# Patient Record
Sex: Male | Born: 1995 | Race: White | Hispanic: No | Marital: Single | Smoking: Former smoker
Health system: Southern US, Community
[De-identification: ages and names within clinical notes are randomized; demographics above are authoritative.]

## PROBLEM LIST (undated history)

## (undated) DIAGNOSIS — F909 Attention-deficit hyperactivity disorder, unspecified type: Secondary | ICD-10-CM

## (undated) DIAGNOSIS — E039 Hypothyroidism, unspecified: Secondary | ICD-10-CM

## (undated) DIAGNOSIS — R413 Other amnesia: Secondary | ICD-10-CM

## (undated) DIAGNOSIS — Z86711 Personal history of pulmonary embolism: Secondary | ICD-10-CM

## (undated) DIAGNOSIS — S60551A Superficial foreign body of right hand, initial encounter: Secondary | ICD-10-CM

## (undated) DIAGNOSIS — F952 Tourette's disorder: Secondary | ICD-10-CM

## (undated) DIAGNOSIS — F319 Bipolar disorder, unspecified: Secondary | ICD-10-CM

## (undated) HISTORY — PX: NO PAST SURGERIES: SHX2092

## (undated) HISTORY — DX: Personal history of pulmonary embolism: Z86.711

---
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
## 2014-08-11 DIAGNOSIS — J029 Acute pharyngitis, unspecified: Secondary | ICD-10-CM | POA: Diagnosis not present

## 2014-08-11 DIAGNOSIS — R0982 Postnasal drip: Secondary | ICD-10-CM | POA: Diagnosis not present

## 2014-08-11 DIAGNOSIS — H578 Other specified disorders of eye and adnexa: Secondary | ICD-10-CM | POA: Diagnosis not present

## 2014-08-11 DIAGNOSIS — R05 Cough: Secondary | ICD-10-CM | POA: Diagnosis not present

## 2014-08-11 DIAGNOSIS — J069 Acute upper respiratory infection, unspecified: Secondary | ICD-10-CM | POA: Diagnosis not present

## 2014-08-11 DIAGNOSIS — F17210 Nicotine dependence, cigarettes, uncomplicated: Secondary | ICD-10-CM | POA: Diagnosis not present

## 2014-08-11 DIAGNOSIS — R079 Chest pain, unspecified: Secondary | ICD-10-CM | POA: Diagnosis not present

## 2014-08-12 DIAGNOSIS — R079 Chest pain, unspecified: Secondary | ICD-10-CM | POA: Diagnosis not present

## 2015-06-02 DIAGNOSIS — F17200 Nicotine dependence, unspecified, uncomplicated: Secondary | ICD-10-CM | POA: Diagnosis not present

## 2015-06-02 DIAGNOSIS — E039 Hypothyroidism, unspecified: Secondary | ICD-10-CM | POA: Diagnosis not present

## 2015-06-02 DIAGNOSIS — S60551A Superficial foreign body of right hand, initial encounter: Secondary | ICD-10-CM | POA: Diagnosis not present

## 2015-06-14 ENCOUNTER — Encounter: Payer: Self-pay | Admitting: Family Medicine

## 2015-06-14 ENCOUNTER — Ambulatory Visit (INDEPENDENT_AMBULATORY_CARE_PROVIDER_SITE_OTHER): Payer: Medicaid Other | Admitting: Family Medicine

## 2015-06-14 VITALS — BP 119/77 | HR 86 | Ht 70.87 in | Wt 158.0 lb

## 2015-06-14 DIAGNOSIS — Z0000 Encounter for general adult medical examination without abnormal findings: Secondary | ICD-10-CM | POA: Diagnosis not present

## 2015-06-14 DIAGNOSIS — F909 Attention-deficit hyperactivity disorder, unspecified type: Secondary | ICD-10-CM | POA: Diagnosis not present

## 2015-06-14 DIAGNOSIS — F319 Bipolar disorder, unspecified: Secondary | ICD-10-CM | POA: Insufficient documentation

## 2015-06-14 DIAGNOSIS — F959 Tic disorder, unspecified: Secondary | ICD-10-CM | POA: Diagnosis not present

## 2015-06-14 DIAGNOSIS — F958 Other tic disorders: Secondary | ICD-10-CM | POA: Insufficient documentation

## 2015-06-14 LAB — COMPLETE METABOLIC PANEL WITH GFR
ALBUMIN: 4.2 g/dL (ref 3.6–5.1)
ALK PHOS: 83 U/L (ref 40–115)
ALT: 17 U/L (ref 9–46)
AST: 25 U/L (ref 10–40)
BILIRUBIN TOTAL: 0.9 mg/dL (ref 0.2–1.2)
BUN: 10 mg/dL (ref 7–25)
CALCIUM: 9.5 mg/dL (ref 8.6–10.3)
CHLORIDE: 104 mmol/L (ref 98–110)
CO2: 25 mmol/L (ref 20–31)
CREATININE: 0.68 mg/dL (ref 0.60–1.35)
GFR, Est Non African American: >89 mL/min (ref >=60)
Glucose, Bld: 95 mg/dL (ref 65–99)
Potassium: 4.3 mmol/L (ref 3.5–5.3)
Sodium: 139 mmol/L (ref 135–146)
TOTAL PROTEIN: 6.8 g/dL (ref 6.1–8.1)

## 2015-06-14 LAB — CBC
HEMATOCRIT: 40 % (ref 39.0–52.0)
Hemoglobin: 13.7 g/dL (ref 13.0–17.0)
MCH: 28.9 pg (ref 26.0–34.0)
MCHC: 34.3 g/dL (ref 30.0–36.0)
MCV: 84.4 fL (ref 78.0–100.0)
MPV: 10.4 fL (ref 8.6–12.4)
PLATELETS: 213 10*3/uL (ref 150–400)
RBC: 4.74 MIL/uL (ref 4.22–5.81)
RDW: 14 % (ref 11.5–15.5)
WBC: 4.3 10*3/uL (ref 4.0–10.5)

## 2015-06-14 LAB — LIPID PANEL
Cholesterol: 123 mg/dL — ABNORMAL LOW (ref 125–170)
HDL: 38 mg/dL — AB (ref >=40)
LDL CALC: 77 mg/dL (ref <110)
TRIGLYCERIDES: 39 mg/dL (ref <150)
Total CHOL/HDL Ratio: 3.2 Ratio (ref <=5.0)
VLDL: 8 mg/dL (ref <30)

## 2015-06-14 NOTE — Progress Notes (Signed)
CC: Vincent Shaw is a 19 y.o. male is here for Establish Care   Subjective: HPI:  Pleasant 19 year old here to establish care  Past medical history significant for bipolar disorder and Tourette syndrome. He has been off of all anti-psychotic medication and psychiatric medication for 11 months and tells me he feels more normal than he did when he was on medication.. He has no complaints today.  Review of Systems - General ROS: negative for - chills, fever, night sweats, weight gain or weight loss Ophthalmic ROS: negative for - decreased vision Psychological ROS: negative for - anxiety or depression ENT ROS: negative for - hearing change, nasal congestion, tinnitus or allergies Hematological and Lymphatic ROS: negative for - bleeding problems, bruising or swollen lymph nodes Breast ROS: negative Respiratory ROS: no cough, shortness of breath, or wheezing Cardiovascular ROS: no chest pain or dyspnea on exertion Gastrointestinal ROS: no abdominal pain, change in bowel habits, or black or bloody stools Genito-Urinary ROS: negative for - genital discharge, genital ulcers, incontinence or abnormal bleeding from genitals Musculoskeletal ROS: negative for - joint pain or muscle pain Neurological ROS: negative for - headaches or memory loss Dermatological ROS: negative for lumps, mole changes, rash and skin lesion changes  Past Medical History  Diagnosis Date  . 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 Bipolar 1 disorder (HCC)   . Tourette syndrome     History reviewed. No pertinent past surgical history. History reviewed. No pertinent family history.  Social History   Social History  . Marital Status: Single    Spouse Name: N/A  . Number of Children: N/A  . Years of Education: N/A   Occupational History  . Not on file.   Social History Main Topics  . Smoking status: Current Every Day Smoker -- 1.00 packs/day    Types: Cigarettes  . Smokeless tobacco: Not on  file  . Alcohol Use: No  . Drug Use: No  . Sexual Activity: Yes    Birth Control/ Protection: None   Other Topics Concern  . Not on file   Social History Narrative     Objective: BP 119/77 mmHg  Pulse 86  Ht 5' 10.87" (1.8 m)  Wt 158 lb (71.668 kg)  BMI 22.12 kg/m2  General: No Acute Distress HEENT: Atraumatic, normocephalic, conjunctivae normal without scleral icterus.  No nasal discharge, hearing grossly intact, TMs with good landmarks bilaterally with no middle ear abnormalities, posterior pharynx clear without oral lesions. Neck: Supple, trachea midline, no cervical nor supraclavicular adenopathy. Pulmonary: Clear to auscultation bilaterally without wheezing, rhonchi, nor rales. Cardiac: Regular rate and rhythm.  No murmurs, rubs, nor gallops. No peripheral edema.  2+ peripheral pulses bilaterally. Abdomen: Bowel sounds normal.  No masses.  Non-tender without rebound.  Negative Murphy's sign. MSK: Grossly intact, no signs of weakness.  Full strength throughout upper and lower extremities.  Full ROM in upper and lower extremities.  No midline spinal tenderness. Neuro: Gait unremarkable, CN II-XII grossly intact.  C5-C6 Reflex 2/4 Bilaterally, L4 Reflex 2/4 Bilaterally.  Cerebellar function intact. Skin: No rashes. Psych: Alert and oriented to person/place/time.  Thought process normal. No anxiety/depression. Assessment & Plan: Vincent Shaw was seen today for establish care.  Diagnoses and all orders for this visit:  Attention deficit hyperactivity disorder (ADHD), unspecified ADHD type  Bipolar I disorder (HCC)  Annual physical exam -     COMPLETE METABOLIC PANEL WITH GFR -     CBC -     Lipid panel  Motor tic disorder   Healthy lifestyle interventions including but not limited to regular exercise, a healthy low fat diet, moderation of salt intake, the dangers of tobacco/alcohol/recreational drug use, nutrition supplementation, and accident avoidance were discussed with  the patient and a handout was provided for future reference. Declines flu shot  No Follow-up on file.

## 2015-06-18 DIAGNOSIS — M795 Residual foreign body in soft tissue: Secondary | ICD-10-CM | POA: Diagnosis not present

## 2015-07-18 ENCOUNTER — Encounter: Payer: Self-pay | Admitting: Family Medicine

## 2015-07-18 DIAGNOSIS — D6851 Activated protein C resistance: Secondary | ICD-10-CM | POA: Insufficient documentation

## 2016-01-21 ENCOUNTER — Telehealth: Payer: Self-pay | Admitting: Family Medicine

## 2016-01-21 DIAGNOSIS — F319 Bipolar disorder, unspecified: Secondary | ICD-10-CM

## 2016-01-21 NOTE — Telephone Encounter (Signed)
Mr. Lucilla LameWhite's grandfather Domenic SchwabDonovan came in today and stated SS is requiring Ollis to have a psych evaluation and he would like for you to place a referral - [facility]

## 2016-01-21 NOTE — Telephone Encounter (Signed)
Grandfather notified

## 2016-01-21 NOTE — Telephone Encounter (Signed)
Referral has been placed, can you give them  [facility]

## 2016-01-21 NOTE — Telephone Encounter (Signed)
Awaiting call back.

## 2016-02-07 ENCOUNTER — Ambulatory Visit (INDEPENDENT_AMBULATORY_CARE_PROVIDER_SITE_OTHER): Payer: Medicaid Other | Admitting: Medical

## 2016-02-07 ENCOUNTER — Encounter (HOSPITAL_COMMUNITY): Payer: Self-pay | Admitting: Medical

## 2016-02-07 VITALS — BP 116/68 | HR 65 | Ht 70.0 in | Wt 156.0 lb

## 2016-02-07 DIAGNOSIS — F952 Tourette's disorder: Secondary | ICD-10-CM

## 2016-02-07 DIAGNOSIS — F39 Unspecified mood [affective] disorder: Secondary | ICD-10-CM

## 2016-02-07 DIAGNOSIS — D6851 Activated protein C resistance: Secondary | ICD-10-CM

## 2016-02-07 DIAGNOSIS — E032 Hypothyroidism due to medicaments and other exogenous substances: Secondary | ICD-10-CM | POA: Diagnosis not present

## 2016-02-07 DIAGNOSIS — F3481 Disruptive mood dysregulation disorder: Secondary | ICD-10-CM

## 2016-02-07 DIAGNOSIS — Z62819 Personal history of unspecified abuse in childhood: Secondary | ICD-10-CM

## 2016-02-07 DIAGNOSIS — F1220 Cannabis dependence, uncomplicated: Secondary | ICD-10-CM

## 2016-02-07 DIAGNOSIS — Z9151 Personal history of suicidal behavior: Secondary | ICD-10-CM

## 2016-02-07 DIAGNOSIS — Z9189 Other specified personal risk factors, not elsewhere classified: Secondary | ICD-10-CM

## 2016-02-07 DIAGNOSIS — Z915 Personal history of self-harm: Secondary | ICD-10-CM

## 2016-02-07 MED ORDER — LAMOTRIGINE 25 MG PO TABS: ORAL_TABLET | ORAL | Status: DC

## 2016-02-07 MED ORDER — CLONIDINE HCL 0.2 MG PO TABS: 0.2000 mg | ORAL_TABLET | Freq: Every day | ORAL | Status: DC

## 2016-02-07 MED ORDER — RISPERIDONE 2 MG PO TABS: 2.0000 mg | ORAL_TABLET | Freq: Two times a day (BID) | ORAL | Status: DC

## 2016-02-07 MED ORDER — TETRABENAZINE 12.5 MG PO TABS: ORAL_TABLET | ORAL | Status: DC
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
## 2016-02-10 DIAGNOSIS — F1221 Cannabis dependence, in remission: Secondary | ICD-10-CM | POA: Insufficient documentation

## 2016-02-10 DIAGNOSIS — Z62819 Personal history of unspecified abuse in childhood: Secondary | ICD-10-CM | POA: Insufficient documentation

## 2016-02-10 DIAGNOSIS — F952 Tourette's disorder: Secondary | ICD-10-CM | POA: Insufficient documentation

## 2016-02-10 DIAGNOSIS — Z915 Personal history of self-harm: Secondary | ICD-10-CM | POA: Insufficient documentation

## 2016-02-10 DIAGNOSIS — Z9151 Personal history of suicidal behavior: Secondary | ICD-10-CM | POA: Insufficient documentation

## 2016-02-12 DIAGNOSIS — E032 Hypothyroidism due to medicaments and other exogenous substances: Secondary | ICD-10-CM | POA: Diagnosis not present

## 2016-02-13 LAB — THYROID PANEL WITH TSH
Free Thyroxine Index: 2.3 (ref 1.4–3.8)
T3 Uptake: 35 % (ref 22–35)
T4 TOTAL: 6.5 ug/dL (ref 4.5–12.0)
TSH: 8.69 mIU/L — ABNORMAL HIGH (ref 0.40–4.50)

## 2016-02-19 ENCOUNTER — Telehealth (HOSPITAL_COMMUNITY): Payer: Self-pay | Admitting: *Deleted

## 2016-02-19 NOTE — Telephone Encounter (Signed)
Prior authorization received. Submitted online with cover my meds. Awaiting response.

## 2016-02-20 ENCOUNTER — Telehealth (HOSPITAL_COMMUNITY): Payer: Self-pay | Admitting: *Deleted

## 2016-02-20 NOTE — Telephone Encounter (Signed)
Response received from [employer] for prior authorization submitted online. Pharmacy required clinical support for prior authorization request of Tetrabenazine 12.5mg. Forms completed and faxed to [phone]. Awaiting response.  

## 2016-02-20 NOTE — Telephone Encounter (Signed)
Response received from [employer] for prior authorization submitted online. Pharmacy required clinical support for prior authorization request of Tetrabenazine 12.5mg . Forms completed and faxed to [phone]. Awaiting response.

## 2016-02-25 ENCOUNTER — Telehealth (HOSPITAL_COMMUNITY): Payer: Self-pay | Admitting: Medical

## 2016-02-25 NOTE — Telephone Encounter (Signed)
Received fax from [employer] for prior authorization #[id number]. Prior authorization was denied for Tetrabenazine. Per [facility], pt will need to try and fail Haldol before insurance will cover medication. Provider notified.

## 2016-02-25 NOTE — Telephone Encounter (Signed)
Grandfather called and stated that his 3rd med was not being covered by insurance and will cost him over $4000. needs something else called in. he could not tell me which med it was he was talking about. he got the Lamictal filled and the clonidine fill.  Prior authorization was submitted online for Tetrabenazine on 02/20/16. Received response on 02/25/16. Medication coverage was denied by insurance. Pt will need to try and fail Haldol. Please advise.

## 2016-02-27 NOTE — Telephone Encounter (Signed)
PT UNABLE TO TOLERATE/FAILED RISPERDAL-EPS UNWILLING TO EXPOSE HIM TO HALDOL RISK-PLEASE ADVISE INSURER-APPEAL DECISION THANKS

## 2016-02-29 ENCOUNTER — Telehealth (HOSPITAL_COMMUNITY): Payer: Self-pay | Admitting: *Deleted

## 2016-02-29 NOTE — Telephone Encounter (Signed)
Prior authorization for tetrabenazine received. Submitted online with cover my meds. Awaiting response to be faxed.

## 2016-03-05 NOTE — Telephone Encounter (Signed)
Thanks

## 2016-03-06 ENCOUNTER — Encounter (HOSPITAL_COMMUNITY): Payer: Self-pay | Admitting: Medical

## 2016-03-06 ENCOUNTER — Ambulatory Visit (INDEPENDENT_AMBULATORY_CARE_PROVIDER_SITE_OTHER): Payer: Medicaid Other | Admitting: Medical

## 2016-03-06 VITALS — BP 112/62 | HR 66 | Ht 70.0 in | Wt 156.4 lb

## 2016-03-06 DIAGNOSIS — Z9151 Personal history of suicidal behavior: Secondary | ICD-10-CM

## 2016-03-06 DIAGNOSIS — Z62819 Personal history of unspecified abuse in childhood: Secondary | ICD-10-CM | POA: Diagnosis not present

## 2016-03-06 DIAGNOSIS — F952 Tourette's disorder: Secondary | ICD-10-CM | POA: Diagnosis not present

## 2016-03-06 DIAGNOSIS — F3481 Disruptive mood dysregulation disorder: Secondary | ICD-10-CM

## 2016-03-06 DIAGNOSIS — Z915 Personal history of self-harm: Secondary | ICD-10-CM

## 2016-03-06 DIAGNOSIS — F39 Unspecified mood [affective] disorder: Secondary | ICD-10-CM | POA: Diagnosis not present

## 2016-03-06 DIAGNOSIS — D6851 Activated protein C resistance: Secondary | ICD-10-CM

## 2016-03-06 DIAGNOSIS — F1220 Cannabis dependence, uncomplicated: Secondary | ICD-10-CM

## 2016-03-06 DIAGNOSIS — Z9189 Other specified personal risk factors, not elsewhere classified: Secondary | ICD-10-CM

## 2016-03-06 NOTE — Progress Notes (Signed)
BH MD/PA/NP OP Progress Note  03/06/2016 3:15 PM Vincent Shaw  MRN:  [id number]  Chief Complaint:  Chief Complaint    Follow-up     Subjective:   HPI:  Visit Diagnosis: No diagnosis found.  Past Psychiatric History:   Past Medical History:  Past Medical History:  Diagnosis Date  . 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 Bipolar 1 disorder (HCC)   . Tourette syndrome    No past surgical history on file.  Family Psychiatric History:   Family History: No family history on file.  Social History:  Social History   Social History  . Marital status: Single    Spouse name: N/A  . Number of children: N/A  . Years of education: N/A   Social History Main Topics  . Smoking status: Light Tobacco Smoker    Packs/day: 0.25    Types: Cigarettes  . Smokeless tobacco: Not on file  . Alcohol use 1.2 oz/week    2 Shots of liquor per week  . Drug use: No  . Sexual activity: Yes    Birth control/ protection: None   Other Topics Concern  . Not on file   Social History Narrative  . No narrative on file    Allergies:  Allergies  Allergen Reactions  . Risperdal [Risperidone] Other (See Comments)    Dyskinesia    Metabolic Disorder Labs: Lab Results  Component Value Date   HGBA1C  07/31/2009    5.9 (NOTE) The ADA recommends the following therapeutic goal for glycemic control related to Hgb A1c measurement: Goal of therapy: <6.5 Hgb A1c  Reference: American Diabetes Association: Clinical Practice Recommendations 2010, Diabetes Care, 2010, 33: (Suppl  1).   MPG 123 07/31/2009   Lab Results  Component Value Date   PROLACTIN  07/31/2009    13.9 (NOTE)     Reference Ranges:                 Male:                       2.1 -  17.1 ng/ml                 Male:   Pregnant          9.7 - 208.5 ng/mL                           Non Pregnant      2.8 -  29.2 ng/mL                           Post  Menopausal   1.8 -  20.3 ng/mL                     Lab  Results  Component Value Date   CHOL 123 (L) 06/14/2015   TRIG 39 06/14/2015   HDL 38 (L) 06/14/2015   CHOLHDL 3.2 06/14/2015   VLDL 8 06/14/2015   LDLCALC 77 06/14/2015   LDLCALC  07/31/2009    100        Total Cholesterol/HDL:CHD Risk Coronary Heart Disease Risk Table                     Men   Women  1/2 Average Risk   3.4   3.3  Average Risk       5.0  4.4  2 X Average Risk   9.6   7.1  3 X Average Risk  23.4   11.0        Use the calculated Patient Ratio above and the CHD Risk Table to determine the patient's CHD Risk.        ATP III CLASSIFICATION (LDL):  <100     mg/dL   Optimal  161-096  mg/dL   Near or Above                    Optimal  130-159  mg/dL   Borderline  045-409  mg/dL   High  >811     mg/dL   Very High     Current Medications: Current Outpatient Prescriptions  Medication Sig Dispense Refill  . cloNIDine (CATAPRES) 0.2 MG tablet Take 1 tablet (0.2 mg total) by mouth at bedtime. (Patient not taking: Reported on 03/06/2016) 30 tablet 2  . guanFACINE (INTUNIV) 4 MG TB24 SR tablet Take by mouth.    . hydrOXYzine (ATARAX/VISTARIL) 50 MG tablet Take by mouth.    . lamoTRIgine (LAMICTAL) 25 MG tablet Take 1 tab QD x 5 days then 2 tabs x 5 days then 3 tabs x 5days then 4 tabs daily (Patient not taking: Reported on 03/06/2016) 90 tablet 0  . levothyroxine (SYNTHROID, LEVOTHROID) 75 MCG tablet Take by mouth.    . tetrabenazine (XENAZINE) 12.5 MG tablet Take 1 tablet daily for 1 week then 2 tablets daily forTICs (Patient not taking: Reported on 03/06/2016) 60 tablet 0  . traZODone (DESYREL) 50 MG tablet Take 50 mg by mouth.     No current facility-administered medications for this visit.     Neurologic: Headache: Negative Seizure: Negative Paresthesias: Negative  Musculoskeletal: Strength & Muscle Tone: within normal limits and Tic of face/head and neck Gait & Station: normal Patient leans: Front and N/A  Psychiatric Specialty Exam: ROS NO  change Psychiatric/Behavioral: Positive for substance abuse. Negative for depression, suicidal ideas, hallucinations and memory loss. The patient has insomnia. The patient is not nervous/anxious.     General ROS: negative for - chills, fever, night sweats, weight gain or weight loss Ophthalmic ROS: negative for - decreased vision Psychological ROS: negative for - anxiety or depression ENT ROS: negative for - hearing change, nasal congestion, tinnitus or allergies Hematological and Lymphatic ROS: negative for - bleeding problems, bruising or swollen lymph nodes Breast ROS: negative Respiratory ROS: no cough, shortness of breath, or wheezing Cardiovascular ROS: no chest pain or dyspnea on exertion Gastrointestinal ROS: no abdominal pain, change in bowel habits, or black or bloody stools Genito-Urinary ROS: negative for - genital discharge, genital ulcers, incontinence or abnormal bleeding from genitals Musculoskeletal ROS: negative for - joint pain or muscle pain Neurological ROS: negative for - headaches or memory loss + Tics Dermatological ROS: negative for lumps, mole changes, rash and skin lesion changes   Blood pressure 112/62, pulse 66, height  (1.778 m), weight 156 lb 6.4 oz (70.9 kg), SpO2 98 %.Body mass index is 22.44 kg/m.  General Appearance: Fairly Groomed  Eye Contact:  Good  Speech:  Clear and Coherent  Volume:  Normal  Mood:  Dysphoric  Affect:  Congruent  Thought Process:  Coherent and Goal Directed  Orientation:  Full (Time, Place, and Person)  Thought Content: WDL, Rumination and Concerned an bout past experieences with medications   Suicidal Thoughts:  No  Homicidal Thoughts:  No  Memory:  Negative  Judgement:  Fair  Insight:  Lacking  Psychomotor Activity:  Normal  Concentration:  Concentration: Good and Attention Span: Good  Recall:  Good  Fund of Knowledge: Limited  Language: Fair  Akathisia:  Negative  Handed:  Right  AIMS (if indicated):  NA   Assets:  Desire for Improvement Financial Resources/Insurance Housing Resilience Social Support Transportation  ADL's:  Intact  Cognition: Impaired,  Mild  Sleep:  Better     Treatment Plan Summary:Start medications.FU 1 month.Begin counseling.Will discuss Cannabis use after rapport firmly established Pt informed of appeal for Toutrette's medication Tetrabenezine   Vincent MornCharles Britteny Fiebelkorn, PA-C 03/06/2016, 3:15 PM

## 2016-04-01 ENCOUNTER — Telehealth (HOSPITAL_COMMUNITY): Payer: Self-pay | Admitting: Medical

## 2016-04-01 ENCOUNTER — Ambulatory Visit (INDEPENDENT_AMBULATORY_CARE_PROVIDER_SITE_OTHER): Payer: Medicare Other | Admitting: Licensed Clinical Social Worker

## 2016-04-01 DIAGNOSIS — Z8659 Personal history of other mental and behavioral disorders: Secondary | ICD-10-CM

## 2016-04-01 DIAGNOSIS — Z62819 Personal history of unspecified abuse in childhood: Secondary | ICD-10-CM

## 2016-04-01 DIAGNOSIS — F3481 Disruptive mood dysregulation disorder: Secondary | ICD-10-CM

## 2016-04-01 DIAGNOSIS — F952 Tourette's disorder: Secondary | ICD-10-CM

## 2016-04-02 NOTE — Telephone Encounter (Signed)
Pt's father contact office to check the status of PA for Tetrabenazin submitted on 02/29/16. Contact [employer] at [phone]. Appeal was submitted for denial of Tetrabenzin. Ref #[id number], awaiting decision within next 72 hours.   Called and informed pt of PA status. Pt f/u appt is schedule for 04/03/16. Pt verbalizes understanding.

## 2016-04-02 NOTE — Telephone Encounter (Signed)
Thanks

## 2016-04-03 ENCOUNTER — Encounter (HOSPITAL_COMMUNITY): Payer: Self-pay | Admitting: Medical

## 2016-04-03 ENCOUNTER — Ambulatory Visit (INDEPENDENT_AMBULATORY_CARE_PROVIDER_SITE_OTHER): Payer: Medicaid Other | Admitting: Medical

## 2016-04-03 VITALS — BP 116/72 | HR 76 | Ht 70.0 in | Wt 156.0 lb

## 2016-04-03 DIAGNOSIS — D6851 Activated protein C resistance: Secondary | ICD-10-CM | POA: Diagnosis not present

## 2016-04-03 DIAGNOSIS — F3481 Disruptive mood dysregulation disorder: Secondary | ICD-10-CM | POA: Diagnosis not present

## 2016-04-03 DIAGNOSIS — F39 Unspecified mood [affective] disorder: Secondary | ICD-10-CM

## 2016-04-03 DIAGNOSIS — F959 Tic disorder, unspecified: Secondary | ICD-10-CM

## 2016-04-03 DIAGNOSIS — F1211 Cannabis abuse, in remission: Secondary | ICD-10-CM

## 2016-04-03 DIAGNOSIS — F319 Bipolar disorder, unspecified: Secondary | ICD-10-CM

## 2016-04-03 DIAGNOSIS — F1210 Cannabis abuse, uncomplicated: Secondary | ICD-10-CM | POA: Diagnosis not present

## 2016-04-03 DIAGNOSIS — F958 Other tic disorders: Secondary | ICD-10-CM

## 2016-04-03 DIAGNOSIS — Z62819 Personal history of unspecified abuse in childhood: Secondary | ICD-10-CM

## 2016-04-03 MED ORDER — LAMOTRIGINE 100 MG PO TABS: 100.0000 mg | ORAL_TABLET | Freq: Every day | ORAL | 2 refills | Status: DC

## 2016-04-03 MED ORDER — LEVOTHYROXINE SODIUM 75 MCG PO TABS: 75.0000 ug | ORAL_TABLET | Freq: Every day | ORAL | 1 refills | Status: DC

## 2016-04-03 MED ORDER — QUETIAPINE FUMARATE 25 MG PO TABS: 25.0000 mg | ORAL_TABLET | Freq: Every evening | ORAL | 2 refills | Status: DC | PRN
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
## 2016-04-04 NOTE — Telephone Encounter (Signed)
Received telephone from [facility]. Approval was given for Tetrabenazine from 04/04/16-07/04/16. Spoke w/ Corrie DandyMary at [facility], approval given for medication, pharmacy will contact pt once rx is ready for pickup.

## 2016-04-04 NOTE — Telephone Encounter (Signed)
Received [facility] dept for ref #[id number] for Tetrabenazine. Provider complete form for expedited appeal. Form was faxed to [phone]. Awaiting response.

## 2016-04-05 NOTE — Telephone Encounter (Signed)
thanks

## 2016-04-07 ENCOUNTER — Encounter (HOSPITAL_COMMUNITY): Payer: Self-pay | Admitting: Licensed Clinical Social Worker

## 2016-04-07 DIAGNOSIS — Z8659 Personal history of other mental and behavioral disorders: Secondary | ICD-10-CM | POA: Insufficient documentation

## 2016-04-07 DIAGNOSIS — F3481 Disruptive mood dysregulation disorder: Secondary | ICD-10-CM | POA: Insufficient documentation
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
## 2016-04-08 ENCOUNTER — Encounter: Payer: Self-pay | Admitting: Family Medicine

## 2016-04-08 ENCOUNTER — Ambulatory Visit (INDEPENDENT_AMBULATORY_CARE_PROVIDER_SITE_OTHER): Payer: Medicare Other | Admitting: Family Medicine

## 2016-04-08 VITALS — BP 127/68 | HR 56 | Wt 157.0 lb

## 2016-04-08 DIAGNOSIS — Z114 Encounter for screening for human immunodeficiency virus [HIV]: Secondary | ICD-10-CM | POA: Diagnosis not present

## 2016-04-08 DIAGNOSIS — Z23 Encounter for immunization: Secondary | ICD-10-CM

## 2016-04-08 DIAGNOSIS — S60551A Superficial foreign body of right hand, initial encounter: Secondary | ICD-10-CM

## 2016-04-08 DIAGNOSIS — E079 Disorder of thyroid, unspecified: Secondary | ICD-10-CM | POA: Diagnosis not present

## 2016-04-08 NOTE — Patient Instructions (Signed)
Thank you for coming in today. Get labs today.  You should hear from Dr Carlos LeveringGramig's office soon.  Return in 3 months for recheck.  We will adjust thyroid medicines based on lab results.

## 2016-04-09 LAB — TSH: TSH: 4.2 m[IU]/L (ref 0.40–4.50)

## 2016-04-09 LAB — CBC
HEMATOCRIT: 42.9 % (ref 38.5–50.0)
HEMOGLOBIN: 14.4 g/dL (ref 13.2–17.1)
MCH: 29.3 pg (ref 27.0–33.0)
MCHC: 33.6 g/dL (ref 32.0–36.0)
MCV: 87.4 fL (ref 80.0–100.0)
MPV: 10.7 fL (ref 7.5–12.5)
PLATELETS: 229 10*3/uL (ref 140–400)
RBC: 4.91 MIL/uL (ref 4.20–5.80)
RDW: 14 % (ref 11.0–15.0)
WBC: 6.2 10*3/uL (ref 3.8–10.8)

## 2016-04-09 LAB — COMPREHENSIVE METABOLIC PANEL
ALBUMIN: 4.4 g/dL (ref 3.6–5.1)
ALK PHOS: 64 U/L (ref 40–115)
ALT: 16 U/L (ref 9–46)
AST: 16 U/L (ref 10–40)
BUN: 15 mg/dL (ref 7–25)
CHLORIDE: 104 mmol/L (ref 98–110)
CO2: 28 mmol/L (ref 20–31)
Calcium: 9.6 mg/dL (ref 8.6–10.3)
Creat: 0.84 mg/dL (ref 0.60–1.35)
Glucose, Bld: 84 mg/dL (ref 65–99)
POTASSIUM: 4 mmol/L (ref 3.5–5.3)
Sodium: 140 mmol/L (ref 135–146)
TOTAL PROTEIN: 7.1 g/dL (ref 6.1–8.1)
Total Bilirubin: 0.5 mg/dL (ref 0.2–1.2)

## 2016-04-09 LAB — T4, FREE: Free T4: 1.2 ng/dL (ref 0.8–1.8)

## 2016-04-09 LAB — T3, FREE: T3 FREE: 3.7 pg/mL (ref 2.3–4.2)

## 2016-04-09 LAB — HIV ANTIBODY (ROUTINE TESTING W REFLEX): HIV: NONREACTIVE

## 2016-04-09 MED ORDER — LEVOTHYROXINE SODIUM 75 MCG PO TABS: 75.0000 ug | ORAL_TABLET | Freq: Every day | ORAL | 1 refills | Status: DC
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
## 2016-04-16 ENCOUNTER — Ambulatory Visit (INDEPENDENT_AMBULATORY_CARE_PROVIDER_SITE_OTHER): Payer: Medicare Other | Admitting: Licensed Clinical Social Worker

## 2016-04-16 DIAGNOSIS — F4310 Post-traumatic stress disorder, unspecified: Secondary | ICD-10-CM | POA: Diagnosis not present

## 2016-04-16 DIAGNOSIS — Z8659 Personal history of other mental and behavioral disorders: Secondary | ICD-10-CM

## 2016-04-16 DIAGNOSIS — F3481 Disruptive mood dysregulation disorder: Secondary | ICD-10-CM | POA: Diagnosis not present

## 2016-04-16 DIAGNOSIS — F952 Tourette's disorder: Secondary | ICD-10-CM | POA: Diagnosis not present

## 2016-04-17 ENCOUNTER — Ambulatory Visit (INDEPENDENT_AMBULATORY_CARE_PROVIDER_SITE_OTHER): Payer: Medicaid Other | Admitting: Medical

## 2016-04-17 ENCOUNTER — Ambulatory Visit (HOSPITAL_COMMUNITY): Payer: Self-pay | Admitting: Licensed Clinical Social Worker

## 2016-04-17 ENCOUNTER — Encounter (HOSPITAL_COMMUNITY): Payer: Self-pay | Admitting: Medical

## 2016-04-17 VITALS — BP 116/70 | HR 60 | Ht 70.0 in | Wt 158.0 lb

## 2016-04-17 DIAGNOSIS — F319 Bipolar disorder, unspecified: Secondary | ICD-10-CM

## 2016-04-17 DIAGNOSIS — Z62819 Personal history of unspecified abuse in childhood: Secondary | ICD-10-CM | POA: Diagnosis not present

## 2016-04-17 DIAGNOSIS — F3481 Disruptive mood dysregulation disorder: Secondary | ICD-10-CM | POA: Diagnosis not present

## 2016-04-17 DIAGNOSIS — F959 Tic disorder, unspecified: Secondary | ICD-10-CM

## 2016-04-17 DIAGNOSIS — D6851 Activated protein C resistance: Secondary | ICD-10-CM

## 2016-04-17 DIAGNOSIS — F1211 Cannabis abuse, in remission: Secondary | ICD-10-CM

## 2016-04-17 DIAGNOSIS — Z8659 Personal history of other mental and behavioral disorders: Secondary | ICD-10-CM

## 2016-04-17 DIAGNOSIS — F1210 Cannabis abuse, uncomplicated: Secondary | ICD-10-CM

## 2016-04-17 DIAGNOSIS — Z915 Personal history of self-harm: Secondary | ICD-10-CM

## 2016-04-17 DIAGNOSIS — F958 Other tic disorders: Secondary | ICD-10-CM

## 2016-04-17 DIAGNOSIS — F4310 Post-traumatic stress disorder, unspecified: Secondary | ICD-10-CM | POA: Insufficient documentation

## 2016-04-17 DIAGNOSIS — Z9151 Personal history of suicidal behavior: Secondary | ICD-10-CM

## 2016-04-17 DIAGNOSIS — Z9189 Other specified personal risk factors, not elsewhere classified: Secondary | ICD-10-CM

## 2016-04-17 MED ORDER — CLONIDINE HCL 0.2 MG PO TABS: 0.2000 mg | ORAL_TABLET | Freq: Every day | ORAL | 2 refills | Status: DC
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
## 2016-04-24 ENCOUNTER — Ambulatory Visit (HOSPITAL_COMMUNITY): Payer: Self-pay | Admitting: Medical

## 2016-04-27 DIAGNOSIS — S60551A Superficial foreign body of right hand, initial encounter: Secondary | ICD-10-CM

## 2016-04-27 HISTORY — DX: Superficial foreign body of right hand, initial encounter: S60.551A

## 2016-05-01 ENCOUNTER — Ambulatory Visit (INDEPENDENT_AMBULATORY_CARE_PROVIDER_SITE_OTHER): Payer: Medicare Other | Admitting: Licensed Clinical Social Worker

## 2016-05-01 DIAGNOSIS — F4310 Post-traumatic stress disorder, unspecified: Secondary | ICD-10-CM | POA: Diagnosis not present

## 2016-05-01 DIAGNOSIS — F3481 Disruptive mood dysregulation disorder: Secondary | ICD-10-CM | POA: Diagnosis not present

## 2016-05-01 DIAGNOSIS — F958 Other tic disorders: Secondary | ICD-10-CM | POA: Diagnosis not present
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
## 2016-05-06 ENCOUNTER — Other Ambulatory Visit: Payer: Self-pay | Admitting: Orthopedic Surgery

## 2016-05-06 DIAGNOSIS — S60551A Superficial foreign body of right hand, initial encounter: Secondary | ICD-10-CM | POA: Diagnosis not present

## 2016-05-08 ENCOUNTER — Encounter (HOSPITAL_BASED_OUTPATIENT_CLINIC_OR_DEPARTMENT_OTHER): Payer: Self-pay | Admitting: *Deleted

## 2016-05-12 ENCOUNTER — Ambulatory Visit (INDEPENDENT_AMBULATORY_CARE_PROVIDER_SITE_OTHER): Payer: Medicaid Other | Admitting: Licensed Clinical Social Worker

## 2016-05-12 DIAGNOSIS — F3481 Disruptive mood dysregulation disorder: Secondary | ICD-10-CM

## 2016-05-12 DIAGNOSIS — F958 Other tic disorders: Secondary | ICD-10-CM | POA: Diagnosis not present

## 2016-05-12 DIAGNOSIS — F4310 Post-traumatic stress disorder, unspecified: Secondary | ICD-10-CM

## 2016-05-12 NOTE — Progress Notes (Signed)
   THERAPIST PROGRESS NOTE  Session Time: 2:00pm-3:05pm  Participation Level: Active  Behavioral Response: Casual A bit drowsy (reported he has not been getting much sleep)  Somewhat anxious  Type of Therapy: Individual therapy  Treatment Goals addressed: Coping effectively with trauma triggers  Interventions: Assessment  Suicidal/Homicidal: Denied both  Therapist Interventions: Continued to learn details about patient's experiences, especially in the last two years.  Explored how his experiences have motivated him to change his life for the better.     Summary:  Opened up about overdosing on his medications in a suicide attempt approximately 2 years ago.  Described how he had gotten to that point of desperation and how his perspective about what is valuable in life changed afterwards.   Noted that he had been involved in a serious relationship that didn't work out.  They had been engaged.  York SpanielSaid that he is not focused on dating at this time.  He wants to get a handle on his emotions so that he is able to focus on going back to school. Reported that he is satisfied with his current living environment.                           Plan: Will explore his current sources of support at next session.        Diagnosis:  Disruptive Mood Dysregulation Disorder                      PTSD                      History of conduct disorder     Darrin LuisSolomon, Sarah A, LCSW 05/12/2016

## 2016-05-15 ENCOUNTER — Ambulatory Visit (HOSPITAL_BASED_OUTPATIENT_CLINIC_OR_DEPARTMENT_OTHER): Payer: Medicare Other | Admitting: Anesthesiology

## 2016-05-15 ENCOUNTER — Ambulatory Visit (HOSPITAL_BASED_OUTPATIENT_CLINIC_OR_DEPARTMENT_OTHER)
Admission: RE | Admit: 2016-05-15 | Discharge: 2016-05-15 | Disposition: A | Discharge status: Home / Self Care | Payer: Medicare Other | Source: Ambulatory Visit | Attending: Orthopedic Surgery | Admitting: Orthopedic Surgery

## 2016-05-15 ENCOUNTER — Encounter (HOSPITAL_BASED_OUTPATIENT_CLINIC_OR_DEPARTMENT_OTHER)
Admission: RE | Disposition: A | Discharge status: Home / Self Care | Payer: Self-pay | Source: Ambulatory Visit | Attending: Orthopedic Surgery

## 2016-05-15 ENCOUNTER — Encounter (HOSPITAL_BASED_OUTPATIENT_CLINIC_OR_DEPARTMENT_OTHER): Payer: Self-pay | Admitting: *Deleted

## 2016-05-15 DIAGNOSIS — F952 Tourette's disorder: Secondary | ICD-10-CM | POA: Diagnosis not present

## 2016-05-15 DIAGNOSIS — E039 Hypothyroidism, unspecified: Secondary | ICD-10-CM | POA: Insufficient documentation

## 2016-05-15 DIAGNOSIS — M795 Residual foreign body in soft tissue: Secondary | ICD-10-CM | POA: Insufficient documentation

## 2016-05-15 DIAGNOSIS — Z888 Allergy status to other drugs, medicaments and biological substances status: Secondary | ICD-10-CM | POA: Diagnosis not present

## 2016-05-15 DIAGNOSIS — Z832 Family history of diseases of the blood and blood-forming organs and certain disorders involving the immune mechanism: Secondary | ICD-10-CM | POA: Insufficient documentation

## 2016-05-15 DIAGNOSIS — Z7982 Long term (current) use of aspirin: Secondary | ICD-10-CM | POA: Insufficient documentation

## 2016-05-15 DIAGNOSIS — F909 Attention-deficit hyperactivity disorder, unspecified type: Secondary | ICD-10-CM | POA: Diagnosis not present

## 2016-05-15 DIAGNOSIS — S60552A Superficial foreign body of left hand, initial encounter: Secondary | ICD-10-CM | POA: Diagnosis not present

## 2016-05-15 DIAGNOSIS — Z79899 Other long term (current) drug therapy: Secondary | ICD-10-CM | POA: Insufficient documentation

## 2016-05-15 DIAGNOSIS — F319 Bipolar disorder, unspecified: Secondary | ICD-10-CM | POA: Insufficient documentation

## 2016-05-15 DIAGNOSIS — S60551A Superficial foreign body of right hand, initial encounter: Secondary | ICD-10-CM | POA: Diagnosis not present

## 2016-05-15 DIAGNOSIS — Z87891 Personal history of nicotine dependence: Secondary | ICD-10-CM | POA: Insufficient documentation

## 2016-05-15 HISTORY — DX: Superficial foreign body of right hand, initial encounter: S60.551A

## 2016-05-15 HISTORY — DX: Hypothyroidism, unspecified: E03.9

## 2016-05-15 HISTORY — DX: Other amnesia: R41.3

## 2016-05-15 HISTORY — DX: Attention-deficit hyperactivity disorder, unspecified type: F90.9

## 2016-05-15 HISTORY — PX: FOREIGN BODY REMOVAL: SHX962

## 2016-05-15 SURGERY — FOREIGN BODY REMOVAL ADULT
Anesthesia: Regional | Site: Hand | Laterality: Right

## 2016-05-15 MED ORDER — HYDROMORPHONE HCL 1 MG/ML IJ SOLN: 0.2500 mg | INTRAMUSCULAR | Status: DC | PRN

## 2016-05-15 MED ORDER — CEFAZOLIN SODIUM-DEXTROSE 2-4 GM/100ML-% IV SOLN
2.0000 g | INTRAVENOUS | Status: AC
  Administered 2016-05-15: 2 g via INTRAVENOUS

## 2016-05-15 MED ORDER — CHLORHEXIDINE GLUCONATE 4 % EX LIQD: 60.0000 mL | Freq: Once | CUTANEOUS | Status: DC

## 2016-05-15 MED ORDER — DEXAMETHASONE SODIUM PHOSPHATE 10 MG/ML IJ SOLN
INTRAMUSCULAR | Status: DC | PRN
  Administered 2016-05-15: 10 mg via INTRAVENOUS

## 2016-05-15 MED ORDER — CEFAZOLIN SODIUM-DEXTROSE 2-4 GM/100ML-% IV SOLN
INTRAVENOUS | Status: AC
  Filled 2016-05-15: qty 100

## 2016-05-15 MED ORDER — ONDANSETRON HCL 4 MG/2ML IJ SOLN
INTRAMUSCULAR | Status: DC | PRN
  Administered 2016-05-15: 4 mg via INTRAVENOUS

## 2016-05-15 MED ORDER — PROMETHAZINE HCL 25 MG/ML IJ SOLN: 6.2500 mg | INTRAMUSCULAR | Status: DC | PRN

## 2016-05-15 MED ORDER — BUPIVACAINE HCL (PF) 0.25 % IJ SOLN
INTRAMUSCULAR | Status: DC | PRN
  Administered 2016-05-15: 6 mL

## 2016-05-15 MED ORDER — LACTATED RINGERS IV SOLN
INTRAVENOUS | Status: DC
  Administered 2016-05-15: 10:00:00 via INTRAVENOUS

## 2016-05-15 MED ORDER — PROPOFOL 500 MG/50ML IV EMUL
INTRAVENOUS | Status: DC | PRN
  Administered 2016-05-15: 25 ug/kg/min via INTRAVENOUS

## 2016-05-15 MED ORDER — FENTANYL CITRATE (PF) 100 MCG/2ML IJ SOLN
INTRAMUSCULAR | Status: AC
  Filled 2016-05-15: qty 2

## 2016-05-15 MED ORDER — MIDAZOLAM HCL 5 MG/5ML IJ SOLN
INTRAMUSCULAR | Status: DC | PRN
  Administered 2016-05-15: 2 mg via INTRAVENOUS

## 2016-05-15 MED ORDER — ONDANSETRON HCL 4 MG/2ML IJ SOLN
INTRAMUSCULAR | Status: AC
  Filled 2016-05-15: qty 2

## 2016-05-15 MED ORDER — GLYCOPYRROLATE 0.2 MG/ML IJ SOLN: 0.2000 mg | Freq: Once | INTRAMUSCULAR | Status: DC | PRN

## 2016-05-15 MED ORDER — FENTANYL CITRATE (PF) 100 MCG/2ML IJ SOLN: 50.0000 ug | INTRAMUSCULAR | Status: DC | PRN

## 2016-05-15 MED ORDER — PROPOFOL 10 MG/ML IV BOLUS
INTRAVENOUS | Status: AC
  Filled 2016-05-15: qty 20

## 2016-05-15 MED ORDER — LIDOCAINE HCL (PF) 0.5 % IJ SOLN
INTRAMUSCULAR | Status: DC | PRN
  Administered 2016-05-15: 32 mL via INTRAVENOUS

## 2016-05-15 MED ORDER — LIDOCAINE 2% (20 MG/ML) 5 ML SYRINGE
INTRAMUSCULAR | Status: AC
  Filled 2016-05-15: qty 5

## 2016-05-15 MED ORDER — SCOPOLAMINE 1 MG/3DAYS TD PT72: 1.0000 | MEDICATED_PATCH | Freq: Once | TRANSDERMAL | Status: DC | PRN

## 2016-05-15 MED ORDER — MIDAZOLAM HCL 2 MG/2ML IJ SOLN: 1.0000 mg | INTRAMUSCULAR | Status: DC | PRN

## 2016-05-15 MED ORDER — FENTANYL CITRATE (PF) 100 MCG/2ML IJ SOLN
INTRAMUSCULAR | Status: DC | PRN
  Administered 2016-05-15: 100 ug via INTRAVENOUS

## 2016-05-15 MED ORDER — MIDAZOLAM HCL 2 MG/2ML IJ SOLN
INTRAMUSCULAR | Status: AC
  Filled 2016-05-15: qty 2

## 2016-05-15 MED ORDER — HYDROCODONE-ACETAMINOPHEN 5-325 MG PO TABS: ORAL_TABLET | ORAL | 0 refills | Status: DC

## 2016-05-15 SURGICAL SUPPLY — 44 items
BANDAGE ACE 3X5.8 VEL STRL LF (GAUZE/BANDAGES/DRESSINGS) IMPLANT
BANDAGE COBAN STERILE 2 (GAUZE/BANDAGES/DRESSINGS) ×2 IMPLANT
BLADE MINI RND TIP GREEN BEAV (BLADE) IMPLANT
BLADE SURG 15 STRL LF DISP TIS (BLADE) ×2 IMPLANT
BLADE SURG 15 STRL SS (BLADE) ×6
BNDG CMPR 9X4 STRL LF SNTH (GAUZE/BANDAGES/DRESSINGS)
BNDG COHESIVE 1X5 TAN STRL LF (GAUZE/BANDAGES/DRESSINGS) IMPLANT
BNDG ELASTIC 2X5.8 VLCR STR LF (GAUZE/BANDAGES/DRESSINGS) IMPLANT
BNDG ESMARK 4X9 LF (GAUZE/BANDAGES/DRESSINGS) IMPLANT
BNDG GAUZE ELAST 4 BULKY (GAUZE/BANDAGES/DRESSINGS) IMPLANT
CHLORAPREP W/TINT 26ML (MISCELLANEOUS) ×3 IMPLANT
CORDS BIPOLAR (ELECTRODE) IMPLANT
COVER BACK TABLE 60X90IN (DRAPES) ×3 IMPLANT
COVER MAYO STAND STRL (DRAPES) ×3 IMPLANT
CUFF TOURNIQUET SINGLE 18IN (TOURNIQUET CUFF) ×3 IMPLANT
DRAPE EXTREMITY T 121X128X90 (DRAPE) ×3 IMPLANT
DRAPE SURG 17X23 STRL (DRAPES) ×3 IMPLANT
GAUZE SPONGE 4X4 12PLY STRL (GAUZE/BANDAGES/DRESSINGS) ×3 IMPLANT
GAUZE XEROFORM 1X8 LF (GAUZE/BANDAGES/DRESSINGS) ×3 IMPLANT
GLOVE BIO SURGEON STRL SZ7 (GLOVE) ×2 IMPLANT
GLOVE BIO SURGEON STRL SZ7.5 (GLOVE) ×3 IMPLANT
GOWN STRL REUS W/ TWL LRG LVL3 (GOWN DISPOSABLE) ×1 IMPLANT
GOWN STRL REUS W/TWL LRG LVL3 (GOWN DISPOSABLE) ×3
GOWN STRL REUS W/TWL XL LVL3 (GOWN DISPOSABLE) ×3 IMPLANT
NDL HYPO 25X1 1.5 SAFETY (NEEDLE) IMPLANT
NDL SAFETY ECLIPSE 18X1.5 (NEEDLE) IMPLANT
NEEDLE HYPO 18GX1.5 SHARP (NEEDLE)
NEEDLE HYPO 25X1 1.5 SAFETY (NEEDLE) IMPLANT
NS IRRIG 1000ML POUR BTL (IV SOLUTION) ×3 IMPLANT
PACK BASIN DAY SURGERY FS (CUSTOM PROCEDURE TRAY) ×3 IMPLANT
PAD CAST 3X4 CTTN HI CHSV (CAST SUPPLIES) IMPLANT
PADDING CAST ABS 4INX4YD NS (CAST SUPPLIES) ×2
PADDING CAST ABS COTTON 4X4 ST (CAST SUPPLIES) ×1 IMPLANT
PADDING CAST COTTON 3X4 STRL (CAST SUPPLIES)
STOCKINETTE 4X48 STRL (DRAPES) ×3 IMPLANT
SUT ETHILON 3 0 PS 1 (SUTURE) IMPLANT
SUT ETHILON 4 0 PS 2 18 (SUTURE) IMPLANT
SUT MON AB 5-0 P3 18 (SUTURE) ×2 IMPLANT
SWAB COLLECTION DEVICE MRSA (MISCELLANEOUS) IMPLANT
SWAB CULTURE ESWAB REG 1ML (MISCELLANEOUS) IMPLANT
SYR BULB 3OZ (MISCELLANEOUS) ×3 IMPLANT
SYR CONTROL 10ML LL (SYRINGE) IMPLANT
TOWEL OR 17X24 6PK STRL BLUE (TOWEL DISPOSABLE) ×6 IMPLANT
UNDERPAD 30X30 (UNDERPADS AND DIAPERS) ×3 IMPLANT

## 2016-05-15 NOTE — Anesthesia Preprocedure Evaluation (Addendum)
Anesthesia Evaluation  Patient identified by MRN, date of birth, ID band Patient awake    Reviewed: Allergy & Precautions, NPO status , Patient's Chart, lab work & pertinent test results  History of Anesthesia Complications Negative for: history of anesthetic complications  Airway Mallampati: II  TM Distance: >3 FB Neck ROM: Full    Dental no notable dental hx. (+) Dental Advisory Given   Pulmonary neg pulmonary ROS, former smoker,    Pulmonary exam normal        Cardiovascular negative cardio ROS Normal cardiovascular exam     Neuro/Psych PSYCHIATRIC DISORDERS Bipolar Disorder negative neurological ROS     GI/Hepatic negative GI ROS, Neg liver ROS,   Endo/Other  Hypothyroidism   Renal/GU negative Renal ROS     Musculoskeletal   Abdominal   Peds  Hematology   Anesthesia Other Findings   Reproductive/Obstetrics                             Anesthesia Physical Anesthesia Plan  ASA: II  Anesthesia Plan: Bier Block   Post-op Pain Management:    Induction: Intravenous  Airway Management Planned: Simple Face Mask  Additional Equipment:   Intra-op Plan:   Post-operative Plan:   Informed Consent: I have reviewed the patients History and Physical, chart, labs and discussed the procedure including the risks, benefits and alternatives for the proposed anesthesia with the patient or authorized representative who has indicated his/her understanding and acceptance.   Dental advisory given  Plan Discussed with: CRNA, Anesthesiologist and Surgeon  Anesthesia Plan Comments:       Anesthesia Quick Evaluation

## 2016-05-15 NOTE — Brief Op Note (Signed)
05/15/2016  11:14 AM  PATIENT:  Lesean Artola  20 y.o. male  PRE-OPERATIVE DIAGNOSIS:  right palm foreign body  POST-OPERATIVE DIAGNOSIS:  right palm foreign body  PROCEDURE:  Procedure(s): FOREIGN BODY REMOVAL right palm (Right)  SURGEON:  Surgeon(s) and Role:    * Betha LoaKevin Rosmery Duggin, MD - Primary  PHYSICIAN ASSISTANT:   ASSISTANTS: none   ANESTHESIA:   IV sedation and Bier block  EBL:  Total I/O In: 500 [I.V.:500] Out: -   BLOOD ADMINISTERED:none  DRAINS: none   LOCAL MEDICATIONS USED:  MARCAINE     SPECIMEN:  No Specimen  DISPOSITION OF SPECIMEN:  N/A  COUNTS:  YES  TOURNIQUET:   Total Tourniquet Time Documented: Forearm (Right) - 20 minutes Total: Forearm (Right) - 20 minutes   DICTATION: .Other Dictation: Dictation Number [id number]  PLAN OF CARE: Discharge to home after PACU  PATIENT DISPOSITION:  PACU - hemodynamically stable.

## 2016-05-15 NOTE — Anesthesia Postprocedure Evaluation (Signed)
Anesthesia Post Note  Patient: Vincent Shaw  Procedure(s) Performed: Procedure(s) (LRB): FOREIGN BODY REMOVAL right palm (Right)  Patient location during evaluation: PACU Anesthesia Type: MAC Level of consciousness: awake and alert Pain management: pain level controlled Vital Signs Assessment: post-procedure vital signs reviewed and stable Respiratory status: spontaneous breathing and respiratory function stable Cardiovascular status: stable Anesthetic complications: no    Last Vitals:  Vitals:   05/15/16 1130 05/15/16 1145  BP: 118/81 109/81  Pulse: (!) 50 (!) 44  Resp: 15 16  Temp:      Last Pain:  Vitals:   05/15/16 1145  TempSrc:   PainSc: 0-No pain                 [name] DANIEL

## 2016-05-15 NOTE — Anesthesia Procedure Notes (Signed)
Procedure Name: MAC Date/Time: 05/15/2016 10:48 AM Performed by: [place] DesanctisLINKA, Countess Biebel L Pre-anesthesia Checklist: Patient identified, Timeout performed, Emergency Drugs available, Suction available and Patient being monitored Patient Re-evaluated:Patient Re-evaluated prior to inductionOxygen Delivery Method: Simple face mask

## 2016-05-15 NOTE — Discharge Instructions (Addendum)
[facility] Instructions °Hand Surgery ° °Wound Care: °Keep your hand elevated above the level of your heart.  Do not allow it to dangle by your side.  Keep the dressing dry and do not remove it unless your doctor advises you to do so.  He will usually change it at the time of your post-op visit.  Moving your fingers is advised to stimulate circulation but will depend on the site of your surgery.  If you have a splint applied, your doctor will advise you regarding movement. ° °Activity: °Do not drive or operate machinery today.  Rest today and then you may return to your normal activity and work as indicated by your physician. ° °Diet:  °Drink liquids today or eat a light diet.  You may resume a regular diet tomorrow.   ° °General expectations: °Pain for two to three days. °Fingers may become slightly swollen. ° °Call your doctor if any of the following occur: °Severe pain not relieved by pain medication. °Elevated temperature. °Dressing soaked with blood. °Inability to move fingers. °Rinks or bluish color to fingers. ° ° ° °Post Anesthesia Home Care Instructions ° °Activity: °Get plenty of rest for the remainder of the day. A responsible adult should stay with you for 24 hours following the procedure.  °For the next 24 hours, DO NOT: °-Drive a car °-Operate machinery °-Drink alcoholic beverages °-Take any medication unless instructed by your physician °-Make any legal decisions or sign important papers. ° °Meals: °Start with liquid foods such as gelatin or soup. Progress to regular foods as tolerated. Avoid greasy, spicy, heavy foods. If nausea and/or vomiting occur, drink only clear liquids until the nausea and/or vomiting subsides. Call your physician if vomiting continues. ° °Special Instructions/Symptoms: °Your throat may feel dry or sore from the anesthesia or the breathing tube placed in your throat during surgery. If this causes discomfort, gargle with warm salt water. The discomfort should disappear within  24 hours. ° °If you had a scopolamine patch placed behind your ear for the management of post- operative nausea and/or vomiting: ° °1. The medication in the patch is effective for 72 hours, after which it should be removed.  Wrap patch in a tissue and discard in the trash. Wash hands thoroughly with soap and water. °2. You may remove the patch earlier than 72 hours if you experience unpleasant side effects which may include dry mouth, dizziness or visual disturbances. °3. Avoid touching the patch. Wash your hands with soap and water after contact with the patch. °  ° °
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
## 2016-05-15 NOTE — Transfer of Care (Signed)
Immediate Anesthesia Transfer of Care Note  Patient: Vincent Shaw  Procedure(s) Performed: Procedure(s): FOREIGN BODY REMOVAL right palm (Right)  Patient Location: PACU  Anesthesia Type:Bier block  Level of Consciousness: awake and patient cooperative  Airway & Oxygen Therapy: Patient Spontanous Breathing and Patient connected to face mask oxygen  Post-op Assessment: Report given to RN and Post -op Vital signs reviewed and stable  Post vital signs: Reviewed and stable  Last Vitals:  Vitals:   05/15/16 1121 05/15/16 1122  BP: 120/77 (P) 120/77  Pulse:  80  Resp:  15  Temp:  (P) 36.6 C    Last Pain:  Vitals:   05/15/16 0903  TempSrc: Oral  PainSc: 3       Patients Stated Pain Goal: 1 (05/15/16 0903)  Complications: No apparent anesthesia complications

## 2016-05-15 NOTE — Op Note (Signed)
[id number] 

## 2016-05-16 ENCOUNTER — Encounter (HOSPITAL_BASED_OUTPATIENT_CLINIC_OR_DEPARTMENT_OTHER): Payer: Self-pay | Admitting: Orthopedic Surgery

## 2016-05-16 NOTE — Addendum Note (Signed)
Addendum  created 05/16/16 [id number] by Jewel Baizeimothy D Ladaisha Portillo, CRNA   Charge Capture section accepted
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
## 2016-05-29 ENCOUNTER — Ambulatory Visit (HOSPITAL_COMMUNITY): Payer: Medicare Other | Admitting: Licensed Clinical Social Worker

## 2016-05-29 ENCOUNTER — Ambulatory Visit (INDEPENDENT_AMBULATORY_CARE_PROVIDER_SITE_OTHER): Payer: Medicare Other | Admitting: Licensed Clinical Social Worker

## 2016-05-29 DIAGNOSIS — F3481 Disruptive mood dysregulation disorder: Secondary | ICD-10-CM

## 2016-05-29 DIAGNOSIS — F4310 Post-traumatic stress disorder, unspecified: Secondary | ICD-10-CM

## 2016-05-29 NOTE — Progress Notes (Signed)
   THERAPIST PROGRESS NOTE  Session Time: 2:10pm-3:05pm  Participation Level: Active  Behavioral Response: Casual Alert  Euthymic  Type of Therapy: Individual therapy  Treatment Goals addressed: Coping effectively with trauma triggers  Interventions: Identifying supports, strengths based   Suicidal/Homicidal: Denied both  Therapist Interventions: Gathered information about current sources of support and the type of support those people provide. Learned about how patient tends to spend his time.  Discussed how he finds it rewarding to help people he cares about.       Summary:  Identified 3 main people who are supportive in different ways: Reported that while his dad doesn't talk much, he does give hugs at times when he can tell patient needs them. His grandfather has helped out a lot with providing transportation since patient does not have a drivers license.   Main source of emotional support comes from his grandmother.  Explained that she often offers advice for how to cope effectively when things are stressful.  Explained that he spends much of his time helping out his neighbor in different ways.  She has some significant physical and mental health problems.  She has two teenage boys he acts as a type of role model for.  Talked about how he thinks of them as family.    Reported feeling good about how he is coping with trauma triggers.  Said "I've reduced my night terrors.  I've learned how to not be on edge.  I've learned how to be self-aware."  Noted he continues to not get much sleep and he is not eating a whole lot.     Plan:    Will focus on strategies for coping with trauma triggers at next session.   Diagnosis:  Disruptive Mood Dysregulation Disorder                      PTSD                      History of conduct disorder     Darrin LuisSolomon, Giara Mcgaughey A, LCSW 05/29/2016

## 2016-06-16 ENCOUNTER — Ambulatory Visit (INDEPENDENT_AMBULATORY_CARE_PROVIDER_SITE_OTHER): Payer: Medicare Other | Admitting: Licensed Clinical Social Worker

## 2016-06-16 DIAGNOSIS — G4700 Insomnia, unspecified: Secondary | ICD-10-CM | POA: Diagnosis not present

## 2016-06-16 DIAGNOSIS — F4310 Post-traumatic stress disorder, unspecified: Secondary | ICD-10-CM
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
## 2016-07-03 ENCOUNTER — Ambulatory Visit (INDEPENDENT_AMBULATORY_CARE_PROVIDER_SITE_OTHER): Payer: Medicare Other | Admitting: Licensed Clinical Social Worker

## 2016-07-03 DIAGNOSIS — F4310 Post-traumatic stress disorder, unspecified: Secondary | ICD-10-CM | POA: Diagnosis not present
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
## 2016-07-08 ENCOUNTER — Ambulatory Visit (INDEPENDENT_AMBULATORY_CARE_PROVIDER_SITE_OTHER): Payer: Medicaid Other | Admitting: Family Medicine

## 2016-07-08 VITALS — BP 121/61 | HR 45 | Wt 160.0 lb

## 2016-07-08 DIAGNOSIS — E079 Disorder of thyroid, unspecified: Secondary | ICD-10-CM

## 2016-07-08 DIAGNOSIS — F909 Attention-deficit hyperactivity disorder, unspecified type: Secondary | ICD-10-CM | POA: Diagnosis not present

## 2016-07-08 DIAGNOSIS — F319 Bipolar disorder, unspecified: Secondary | ICD-10-CM | POA: Diagnosis not present

## 2016-07-08 NOTE — Progress Notes (Signed)
       Vincent Shaw is a 20 y.o. male who [facility] today for follow-up mood disorder. Patient is doing well with no complaints today. He sees a Therapist, sportspsychiatrist and counselor regularly. He feels as though he is at his baseline.  Thyroid disease: Patient is doing well with his levothyroxine. He denies feeling too hot or too cold. No change in symptoms.   Past Medical History:  Diagnosis Date  . ADHD (attention deficit hyperactivity disorder)   . Bipolar 1 disorder (HCC)   . Foreign body of right hand 04/2016   palm  . Hypothyroidism   . Memory impairment   . Tourette syndrome    Past Surgical History:  Procedure Laterality Date  . FOREIGN BODY REMOVAL Right 05/15/2016   Procedure: FOREIGN BODY REMOVAL right palm;  Surgeon: Betha LoaKevin Kuzma, MD;  Location: [facility];  Service: Orthopedics;  Laterality: Right;  . NO PAST SURGERIES     Social History  Substance Use Topics  . Smoking status: Former Smoker    Packs/day: 0.00    Quit date: 03/03/2016  . Smokeless tobacco: Never Used  . Alcohol use No   family history includes Factor V Leiden deficiency in his paternal aunt.  ROS as above:  Medications: Current Outpatient Prescriptions  Medication Sig Dispense Refill  . aspirin EC 81 MG tablet Take 81 mg by mouth daily.    . cloNIDine (CATAPRES) 0.2 MG tablet Take 1 tablet (0.2 mg total) by mouth at bedtime. 30 tablet 2  . HYDROcodone-acetaminophen (NORCO) 5-325 MG tablet 1-2 tabs po q6 hours prn pain 20 tablet 0  . lamoTRIgine (LAMICTAL) 100 MG tablet Take 100 mg by mouth daily.    Marland Kitchen. levothyroxine (SYNTHROID, LEVOTHROID) 75 MCG tablet Take 1 tablet (75 mcg total) by mouth daily before breakfast. 90 tablet 1  . QUEtiapine (SEROQUEL) 25 MG tablet Take 1 tablet (25 mg total) by mouth at bedtime as needed and may repeat dose one time if needed. 60  tablet 2  . tetrabenazine (XENAZINE) 12.5 MG tablet Take 12.5 mg by mouth 2 (two) times daily.     No current facility-administered medications for this visit.    Allergies  Allergen Reactions  . Risperdal [Risperidone] Other (See Comments)    DYSKINESIA    Health Maintenance Health Maintenance  Topic Date Due  . INFLUENZA VACCINE  04/08/2017 (Originally 02/26/2016)  . TETANUS/TDAP  04/08/2026  . HIV Screening  Completed     Exam:  BP 121/61   Pulse (!) 45   Wt 160 lb (72.6 kg)   BMI 22.96 kg/m  Gen: Well NAD HEENT: EOMI,  MMM No goiter Lungs: Normal work of breathing. CTABL Heart: Mild bradycardia no MRG Abd: NABS, Soft. Nondistended, Nontender Exts: Brisk capillary refill, warm and well perfused.    No results found for this or any previous visit (from the past 72 hour(s)). No results found.    Assessment and Plan: 20 y.o. male with  Hypothyroid: Doing well continue current regimen recheck thyroid in 6 months.  Mood disorder multifactorial managed with psychiatry. Continue current regimen.   No orders of the defined types were placed in this encounter.   Discussed warning signs or symptoms. Please see discharge instructions. Patient expresses understanding.

## 2016-07-08 NOTE — Patient Instructions (Signed)
Thank you for coming in today. Return in about 6 months or sooner if needed.

## 2016-07-10 ENCOUNTER — Ambulatory Visit (INDEPENDENT_AMBULATORY_CARE_PROVIDER_SITE_OTHER): Payer: Medicare Other | Admitting: Medical

## 2016-07-10 ENCOUNTER — Encounter (HOSPITAL_COMMUNITY): Payer: Self-pay | Admitting: Medical

## 2016-07-10 VITALS — BP 116/70 | HR 60 | Resp 16 | Ht 70.0 in | Wt 157.0 lb

## 2016-07-10 DIAGNOSIS — F4310 Post-traumatic stress disorder, unspecified: Secondary | ICD-10-CM

## 2016-07-10 DIAGNOSIS — F3481 Disruptive mood dysregulation disorder: Secondary | ICD-10-CM

## 2016-07-10 DIAGNOSIS — G4700 Insomnia, unspecified: Secondary | ICD-10-CM | POA: Diagnosis not present

## 2016-07-10 DIAGNOSIS — D6851 Activated protein C resistance: Secondary | ICD-10-CM

## 2016-07-10 DIAGNOSIS — F1211 Cannabis abuse, in remission: Secondary | ICD-10-CM

## 2016-07-10 DIAGNOSIS — F319 Bipolar disorder, unspecified: Secondary | ICD-10-CM | POA: Diagnosis not present

## 2016-07-10 DIAGNOSIS — Z62819 Personal history of unspecified abuse in childhood: Secondary | ICD-10-CM

## 2016-07-10 DIAGNOSIS — F958 Other tic disorders: Secondary | ICD-10-CM

## 2016-07-10 MED ORDER — CLONIDINE HCL 0.2 MG PO TABS: 0.2000 mg | ORAL_TABLET | Freq: Every day | ORAL | 2 refills | Status: DC

## 2016-07-10 MED ORDER — TETRABENAZINE 12.5 MG PO TABS: 12.5000 mg | ORAL_TABLET | Freq: Two times a day (BID) | ORAL | 2 refills | Status: DC

## 2016-07-10 MED ORDER — QUETIAPINE FUMARATE 25 MG PO TABS
25.0000 mg | ORAL_TABLET | Freq: Every evening | ORAL | 2 refills | Status: DC | PRN
Start: 2016-07-10 — End: 2017-01-06

## 2016-07-10 MED ORDER — LAMOTRIGINE 100 MG PO TABS: 100.0000 mg | ORAL_TABLET | Freq: Every day | ORAL | 2 refills | Status: DC
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
## 2016-07-17 ENCOUNTER — Ambulatory Visit (INDEPENDENT_AMBULATORY_CARE_PROVIDER_SITE_OTHER): Payer: Medicare Other | Admitting: Licensed Clinical Social Worker

## 2016-07-17 DIAGNOSIS — F4310 Post-traumatic stress disorder, unspecified: Secondary | ICD-10-CM | POA: Diagnosis not present

## 2016-07-17 NOTE — Progress Notes (Signed)
   THERAPIST PROGRESS NOTE  Session Time: 2:10pm-3:00pm  Participation Level: Active  Behavioral Response:  Casual  Alert  Euthymic  Type of Therapy: Individual therapy  Treatment Goals addressed: Coping effectively with trauma triggers  Interventions: CBT  Suicidal/Homicidal: Denied both  Therapist Interventions:  Asked if patient had done his homework to complete entries on a Self Monitoring of Feelings Form. Assisted patient with completing one entry on the form.  Talked about how using this tool can be helpful for considering alternative ways of thinking about situations and assessing how you coped with your feelings, determining whether the outcomes were positive or negative.              Summary:  Reported he forgot to complete the homework. Provided an example of a situation when he ended up feeling "hostile" recently.  In the moment he chose to walk away from the source of frustration and focus on calming himself.  This allowed him to take some time to consider how he wanted to address the problem most effectively.  Noted that he had an urge to challenge the person causing problems, but upon reflection he has decided it wouldn't be worthwhile to respond in that manner.     Agreed to complete some entries between now and next session.  Indicated that overall he has been feeling satisfied with how he has been coping with trauma triggers and that he is making progress with moving forward with his personal goals.  Planning to get his GED and then pursue going into the [employer].     Plan: Will review homework at next session.  Diagnosis:                        PTSD                      Disruptive Mood Dysregulation Disorder                          Darrin LuisSolomon, Raidyn Breiner A, LCSW 07/17/2016

## 2016-08-07 ENCOUNTER — Encounter (HOSPITAL_COMMUNITY): Payer: Self-pay | Admitting: Medical

## 2016-08-07 ENCOUNTER — Ambulatory Visit (INDEPENDENT_AMBULATORY_CARE_PROVIDER_SITE_OTHER): Payer: Medicaid Other | Admitting: Medical

## 2016-08-07 VITALS — BP 124/76 | HR 66 | Resp 18 | Ht 70.0 in | Wt 162.0 lb

## 2016-08-07 DIAGNOSIS — F319 Bipolar disorder, unspecified: Secondary | ICD-10-CM

## 2016-08-07 DIAGNOSIS — Z62819 Personal history of unspecified abuse in childhood: Secondary | ICD-10-CM | POA: Diagnosis not present

## 2016-08-07 DIAGNOSIS — D6851 Activated protein C resistance: Secondary | ICD-10-CM

## 2016-08-07 DIAGNOSIS — F4310 Post-traumatic stress disorder, unspecified: Secondary | ICD-10-CM | POA: Diagnosis not present

## 2016-08-07 DIAGNOSIS — F39 Unspecified mood [affective] disorder: Secondary | ICD-10-CM

## 2016-08-07 DIAGNOSIS — F1220 Cannabis dependence, uncomplicated: Secondary | ICD-10-CM

## 2016-08-07 DIAGNOSIS — E032 Hypothyroidism due to medicaments and other exogenous substances: Secondary | ICD-10-CM

## 2016-08-07 DIAGNOSIS — F952 Tourette's disorder: Secondary | ICD-10-CM
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
## 2016-08-11 ENCOUNTER — Ambulatory Visit (INDEPENDENT_AMBULATORY_CARE_PROVIDER_SITE_OTHER): Payer: Medicaid Other | Admitting: Licensed Clinical Social Worker

## 2016-08-11 DIAGNOSIS — F4310 Post-traumatic stress disorder, unspecified: Secondary | ICD-10-CM | POA: Diagnosis not present

## 2016-08-11 DIAGNOSIS — G4700 Insomnia, unspecified: Secondary | ICD-10-CM

## 2016-08-11 NOTE — Progress Notes (Signed)
   THERAPIST PROGRESS NOTE  Session Time: 9:05am-10:05am  Participation Level: Active  Behavioral Response:  Bizarre appearance-wearing a sleeveless top when the outdoor temp is below freezing Tired-admitted he hasn't slept in the past 3 days because he has been so focused on caring for others Distracted     Type of Therapy: Individual therapy  Treatment Goals addressed: Coping effectively with trauma triggers  Interventions: CBT  Suicidal/Homicidal: Denied both  Therapist Interventions:  Reviewed patient's homework: the Self Monitoring of Feelings Form.  Helped him to differentiate between triggers and thoughts.  Talked to him about potential benefits of taking time to identify thoughts, feelings, and responses.     Explored how one of his preferred coping strategies is to focus on helping others Provided patient with a blank Self Monitoring of Feelings form so that he can continue to record entries.     Summary:   Filled out 3 entries on the form.  Seemed to have trouble identifying his thoughts in each of the examples.  This is something therapist will need to help him to practice.     Expressed a belief that choosing to focus on helping others has also helped him to make progress with his own recovery.  He encourages others to practice self-care, break down tasks into smaller steps and focus on completing one task at a time, and consider reasons why their lives are worth living.  He said, "As I'm teaching them, I'm also teaching myself.  I'm developing a better sense of discipline and responsibility.  Helping others motivates me to get up each day and get things done."   Noted that within the past few weeks he has been focused on helping his dad and his male friend/neighbor.  His dad recently had a seizure because he hadn't been taking his medication.  Noted his dad is often depressed and needs outside motivation to take care of himself.  Reported that his friend has been suicidal  and has also neglected taking care of herself.  He reminds her that her children need her and love her.            Plan:  Will review three channels of emotion at next session.  Diagnosis:                        PTSD                                               Darrin LuisSolomon, Sarah A, LCSW 08/11/2016

## 2016-08-27 ENCOUNTER — Ambulatory Visit (INDEPENDENT_AMBULATORY_CARE_PROVIDER_SITE_OTHER): Payer: Medicaid Other | Admitting: Licensed Clinical Social Worker

## 2016-08-27 DIAGNOSIS — F4310 Post-traumatic stress disorder, unspecified: Secondary | ICD-10-CM | POA: Diagnosis not present

## 2016-08-27 NOTE — Progress Notes (Signed)
   THERAPIST PROGRESS NOTE  Session Time: 2:00pm-3:15pm  Participation Level: Active  Behavioral Response:    Well groomed  Drowsy  Euthymic  Type of Therapy: Individual therapy  Treatment Goals addressed: Coping effectively with trauma triggers  Interventions: Assessment   Suicidal/Homicidal: Denied both  Therapist Interventions:  Discussed patient's plans to pursue going into the military within the next year.   Had patient complete a PCL-5 to assess for PTSD-related symptoms.  Compared his responses to those he gave in September. Encouraged patient to reflect on what has helped him to move forward despite his history of trauma.   Summary:   Talked about how he wants to challenge himself by joining the [employer].  Indicated that it is important to him to test his limits and prove to himself that he is capable of achieving his dreams.   These plans seem unrealistic when you consider the fact that patient has no work experience and his current way of life is very unstructured.        Score on the PCL-5 was 38.  In September it was 54.  This is a signficant improvement.  Indicated he is not using avoidance as a coping strategy as often as he used to, that he has developed some positive beliefs about himself, and he has been able to connect with others.  Noted that he uses meditation to cope quite a bit.  He is able to recognize unhelpful thoughts when they show up and shift his focus so that he doesn't get caught up in those thoughts.   Plan:  After patient's session his great grandfather, Domenic SchwabDonovan requested to talk to therapist.  Patient approved this.  Domenic Schwabonovan asked therapist for her opinion regarding patient's ability to work.  He expressed concern about the potential for patient to be unable to manage feelings of anger effectively.  Therapist acknowledged that it is difficult to fully assess patient's progress without input from those closest to him.  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   Asked patient if he would be open to having his great grandfather and/or his father participate in his therapy sessions.  Patient said he would be in favor of this.    Diagnosis:  PTSD                                               Darrin LuisSolomon, Platon Arocho A, LCSW 08/27/2016

## 2016-09-25 ENCOUNTER — Ambulatory Visit (HOSPITAL_COMMUNITY): Payer: Self-pay | Admitting: Licensed Clinical Social Worker

## 2016-10-02 ENCOUNTER — Ambulatory Visit (HOSPITAL_COMMUNITY): Payer: Self-pay | Admitting: Medical

## 2016-11-26 ENCOUNTER — Emergency Department (INDEPENDENT_AMBULATORY_CARE_PROVIDER_SITE_OTHER)
Admission: EM | Admit: 2016-11-26 | Discharge: 2016-11-26 | Disposition: A | Discharge status: Home / Self Care | Payer: Medicaid Other | Source: Home / Self Care | Attending: Family Medicine | Admitting: Family Medicine

## 2016-11-26 ENCOUNTER — Emergency Department (INDEPENDENT_AMBULATORY_CARE_PROVIDER_SITE_OTHER): Payer: Medicaid Other

## 2016-11-26 ENCOUNTER — Encounter: Payer: Self-pay | Admitting: Emergency Medicine

## 2016-11-26 DIAGNOSIS — M79631 Pain in right forearm: Secondary | ICD-10-CM | POA: Diagnosis not present

## 2016-11-26 DIAGNOSIS — M25531 Pain in right wrist: Secondary | ICD-10-CM | POA: Diagnosis not present

## 2016-11-26 DIAGNOSIS — S63501A Unspecified sprain of right wrist, initial encounter: Secondary | ICD-10-CM | POA: Diagnosis not present

## 2016-11-26 NOTE — Discharge Instructions (Signed)
Apply ice pack for 30 minutes every 1 to 2 hours today and tomorrow.  Elevate.  Wear sling until improved.  Wear brace for about 2 to 3 weeks.  Begin range of motion and stretching exercises in about 5 days as per instruction sheet.  May continue aspirin.  May take Tylenol for pain.
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
## 2016-12-20 ENCOUNTER — Encounter: Payer: Self-pay | Admitting: Emergency Medicine

## 2016-12-20 ENCOUNTER — Emergency Department (INDEPENDENT_AMBULATORY_CARE_PROVIDER_SITE_OTHER)
Admission: EM | Admit: 2016-12-20 | Discharge: 2016-12-20 | Disposition: A | Discharge status: Home / Self Care | Payer: Medicaid Other | Source: Home / Self Care

## 2016-12-20 DIAGNOSIS — R202 Paresthesia of skin: Secondary | ICD-10-CM
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
## 2016-12-20 NOTE — Discharge Instructions (Signed)
Resume daily aspirin. If symptoms become significantly worse during the night or over the weekend, proceed to the local emergency room.

## 2016-12-20 NOTE — ED Triage Notes (Signed)
Pt states that he began having pain in his left shoulder, numbness then radiated up his neck and to his jaw.  Started having some trouble speaking, numbness down left arm and leg as well.

## 2016-12-22 ENCOUNTER — Telehealth: Payer: Self-pay | Admitting: *Deleted

## 2016-12-22 NOTE — Telephone Encounter (Signed)
Callback: NO answer, voicemail has not been set up.

## 2017-01-06 ENCOUNTER — Ambulatory Visit (INDEPENDENT_AMBULATORY_CARE_PROVIDER_SITE_OTHER): Payer: Medicaid Other | Admitting: Family Medicine

## 2017-01-06 ENCOUNTER — Encounter: Payer: Self-pay | Admitting: Family Medicine

## 2017-01-06 VITALS — BP 123/74 | HR 50 | Wt 164.0 lb

## 2017-01-06 DIAGNOSIS — D6851 Activated protein C resistance: Secondary | ICD-10-CM | POA: Diagnosis not present

## 2017-01-06 DIAGNOSIS — F952 Tourette's disorder: Secondary | ICD-10-CM

## 2017-01-06 DIAGNOSIS — G459 Transient cerebral ischemic attack, unspecified: Secondary | ICD-10-CM | POA: Insufficient documentation

## 2017-01-06 DIAGNOSIS — F319 Bipolar disorder, unspecified: Secondary | ICD-10-CM | POA: Diagnosis not present

## 2017-01-06 DIAGNOSIS — E079 Disorder of thyroid, unspecified: Secondary | ICD-10-CM | POA: Diagnosis not present

## 2017-01-06 LAB — CBC
HCT: 44 % (ref 38.5–50.0)
Hemoglobin: 14.4 g/dL (ref 13.2–17.1)
MCH: 29 pg (ref 27.0–33.0)
MCHC: 32.7 g/dL (ref 32.0–36.0)
MCV: 88.7 fL (ref 80.0–100.0)
MPV: 10.9 fL (ref 7.5–12.5)
PLATELETS: 227 10*3/uL (ref 140–400)
RBC: 4.96 MIL/uL (ref 4.20–5.80)
RDW: 13.6 % (ref 11.0–15.0)
WBC: 5.7 10*3/uL (ref 3.8–10.8)

## 2017-01-06 MED ORDER — QUETIAPINE FUMARATE 25 MG PO TABS: 25.0000 mg | ORAL_TABLET | Freq: Every evening | ORAL | 2 refills | Status: DC | PRN

## 2017-01-06 MED ORDER — LAMOTRIGINE 25 MG PO TABS: 25.0000 mg | ORAL_TABLET | Freq: Every day | ORAL | 0 refills | Status: DC

## 2017-01-06 NOTE — Progress Notes (Signed)
Vincent Shaw is a 21 y.o. male who [facility] today for return to care.  Vincent Shaw has been lost to medical care for the last 6 months. His medical history is significant for bipolar disorder complicated by ADHD Tourette's and hypothyroidism. He was doing pretty well with counseling and medications including Seroquel Lamictal clonidine and tetrabenzine. He stopped taking his medications because he was doing well and felt as though he did not need them anymore.  In the interim things have worsened. He suffered a few weeks ago had episode of left-sided facial droop and left-sided numbness. This lasted several minutes. He attributes this to a TIA. He notes a family history of hypercoagulable status and a personal history of factor V Leiden.  He notes that he's had several of these episodes attributable to TIA. He's not sure if he's ever had much of a workup for it.  Additionally he notes that his mental health is worsening. He has not slept more than an hour a night for the last week and feels a little irritable. He thinks his symptoms are worsening. He denies any SI or HI and does not note any hallucinations currently. His grandfather says that he is not expressing hallucinations or delusions.  He has lost his job because he left work abruptly when suffering the symptoms described above is a TIA.   Past Medical History:  Diagnosis Date  . ADHD (attention deficit hyperactivity disorder)   . Bipolar 1 disorder (HCC)   . Foreign body of right hand 04/2016   palm  . Hypothyroidism   . Memory impairment   . Tourette syndrome    Past Surgical History:  Procedure Laterality Date  . FOREIGN BODY REMOVAL Right 05/15/2016   Procedure: FOREIGN BODY REMOVAL right palm;  Surgeon: Betha LoaKevin Kuzma, MD;  Location: [facility];  Service: Orthopedics;  Laterality:  Right;  . NO PAST SURGERIES     Social History  Substance Use Topics  . Smoking status: Light Tobacco Smoker    Packs/day: 0.25    Types: Cigarettes    Last attempt to quit: 03/03/2016  . Smokeless tobacco: Never Used  . Alcohol use No   family history includes Factor V Leiden deficiency in his paternal aunt; Hyperlipidemia in his mother; Hypertension in his mother.  ROS as above:  Medications: Current Outpatient Prescriptions  Medication Sig Dispense Refill  . aspirin EC 81 MG tablet Take 81 mg by mouth daily as needed.     . lamoTRIgine (LAMICTAL) 25 MG tablet Take 1 tablet (25 mg total) by mouth daily. 30 tablet 0  . QUEtiapine (SEROQUEL) 25 MG tablet Take 1 tablet (25 mg total) by mouth at bedtime as needed and may repeat dose one time if needed. 60 tablet 2   No current facility-administered medications for this visit.    Allergies  Allergen Reactions  . Risperdal [Risperidone] Other (See Comments)    DYSKINESIA    Health Maintenance Health Maintenance  Topic Date Due  . INFLUENZA VACCINE  04/08/2017 (Originally 02/25/2017)  . TETANUS/TDAP  04/08/2026  . HIV Screening  Completed     Exam:  BP 123/74   Pulse (!) 50   Wt 164 lb (74.4 kg)   BMI 24.22 kg/m  Gen: Well NAD HEENT: EOMI,  MMM Lungs: Normal work of breathing. CTABL Heart: Bradycardia but regular no MRG Abd: NABS, Soft. Nondistended, Nontender Exts: Brisk capillary refill, warm and well  perfused.  Neuro: Alert and oriented normal coordination balance strength and sensation. Motor tics present throughout exam. Cranial nerve exam sensation and strength and reflexes throughout. Psych: Alert and oriented normal speech. Thought process is linear and goal-directed. Patient expresses one possible delusion when he says he can go to [facility] because he has enemies there. No SI or HI expressed.   No results found for this or any previous visit (from the past 72 hour(s)). No results found.    Assessment  and Plan: 21 y.o. male with  Worsening mental status: Patient is a history for bipolar disorder. I suspect he started a manic phase potentially hypomanic phase. Plan to restart Seroquel as this will certainly help insomnia. Additionally we'll restart Lamictal. We'll start at 25 mg daily and titrate up to 100 mg daily. Recheck in 2 weeks. Precautions reviewed.  Neuro symptoms: Patient notes symptoms of facial droop and body numbness. I'm not sure if this really was a TIA or not. Symptoms could be related to his psychiatric status or some other neurological process. His neurological exam is normal now. His cardiac exam is normal aside from bradycardia. Plan for workup including MRI brain, carotid Doppler exam, echocardiogram and hypercoagulable workup listed below.  Recheck in 2 weeks.   Orders Placed This Encounter  Procedures  . MR Brain Wo Contrast    Standing Status:   Future    Standing Expiration Date:   03/08/2018    Order Specific Question:   Reason for Exam (SYMPTOM  OR DIAGNOSIS REQUIRED)    Answer:   eval poss TIA    Order Specific Question:   What is the patient's sedation requirement?    Answer:   No Sedation    Order Specific Question:   Does the patient have a pacemaker or implanted devices?    Answer:   No    Order Specific Question:   Preferred imaging location?    Answer:   [facility] (table limit-350lbs)    Order Specific Question:   Radiology Contrast Protocol - do NOT remove file path    Answer:   [email or web address]  . Hypercoagulable panel, comprehensive  . CBC  . COMPLETE METABOLIC PANEL WITH GFR  . TSH  . T4, free  . T3, free  . ECHOCARDIOGRAM COMPLETE    Standing Status:   Future    Standing Expiration Date:   04/08/2018    Order Specific Question:   Where should this test be performed    Answer:   [facility]    Order Specific Question:   Perflutren DEFINITY (image enhancing agent) should be administered unless  hypersensitivity or allergy exist    Answer:   Administer Perflutren    Order Specific Question:   Expected Date:    Answer:   1 month   Meds ordered this encounter  Medications  . QUEtiapine (SEROQUEL) 25 MG tablet    Sig: Take 1 tablet (25 mg total) by mouth at bedtime as needed and may repeat dose one time if needed.    Dispense:  60 tablet    Refill:  2  . lamoTRIgine (LAMICTAL) 25 MG tablet    Sig: Take 1 tablet (25 mg total) by mouth daily.    Dispense:  30 tablet    Refill:  0     Discussed warning signs or symptoms. Please see discharge instructions. Patient expresses understanding.  I spent 40 minutes with this patient, greater than 50% was face-to-face time counseling regarding the above  diagnosis.

## 2017-01-06 NOTE — Patient Instructions (Signed)
Thank you for coming in today. Restart Seroquel at night.  Restart Lamictal 25mg  in the morning.  We will do labs today.  We will arrange for a hear ultrasound, an ultrasound of the neck and a MRI of the brain.  Recheck in 2 weeks.  Return sooner if needed.  Continue Aspirin.

## 2017-01-07 LAB — COMPLETE METABOLIC PANEL WITH GFR
ALT: 12 U/L (ref 9–46)
AST: 13 U/L (ref 10–40)
Albumin: 4.2 g/dL (ref 3.6–5.1)
Alkaline Phosphatase: 69 U/L (ref 40–115)
BILIRUBIN TOTAL: 0.5 mg/dL (ref 0.2–1.2)
BUN: 16 mg/dL (ref 7–25)
CO2: 27 mmol/L (ref 20–31)
CREATININE: 1.19 mg/dL (ref 0.60–1.35)
Calcium: 9.4 mg/dL (ref 8.6–10.3)
Chloride: 104 mmol/L (ref 98–110)
GFR, Est African American: >89 mL/min (ref >=60)
GFR, Est Non African American: 86 mL/min (ref >=60)
GLUCOSE: 86 mg/dL (ref 65–99)
Potassium: 4.3 mmol/L (ref 3.5–5.3)
SODIUM: 140 mmol/L (ref 135–146)
TOTAL PROTEIN: 6.9 g/dL (ref 6.1–8.1)

## 2017-01-07 LAB — TSH: TSH: 6.57 m[IU]/L — AB (ref 0.40–4.50)

## 2017-01-07 LAB — T3, FREE: T3, Free: 3.7 pg/mL (ref 2.3–4.2)

## 2017-01-07 LAB — T4, FREE: Free T4: 1.2 ng/dL (ref 0.8–1.8)

## 2017-01-07 MED ORDER — LEVOTHYROXINE SODIUM 75 MCG PO TABS: 75.0000 ug | ORAL_TABLET | Freq: Every day | ORAL | 1 refills | Status: DC

## 2017-01-07 NOTE — Addendum Note (Signed)
Addended by: Rodolph BongOREY, Cline Draheim S on: 01/07/2017 07:45 AM   Modules accepted: Orders

## 2017-01-08 LAB — RFX PTT-LA W/RFX TO HEX PHASE CONF: PTT-LA SCREEN: 36 s (ref <=40)

## 2017-01-08 LAB — RFX DRVVT SCR W/RFLX CONF 1:1 MIX: DRVVT SCREEN: 34 s (ref <=45)

## 2017-01-10 LAB — HYPERCOAGULABLE PANEL, COMPREHENSIVE
ANTITHROMB III FUNC: 105 %{activity} (ref 80–120)
Beta-2 Glyco I IgG: <9 SGU (ref <=20)
Beta-2-Glycoprotein I IgA: <9 SAU (ref <=20)
Beta-2-Glycoprotein I IgM: <9 SMU (ref <=20)
PROTEIN C ACTIVITY: 82 % (ref 70–180)
PROTEIN S ANTIGEN, TOTAL: 83 % (ref 70–140)
Protein C Antigen: 92 % (ref 70–140)
Protein S Activity: 72 % (ref 70–150)

## 2017-01-12 ENCOUNTER — Ambulatory Visit (INDEPENDENT_AMBULATORY_CARE_PROVIDER_SITE_OTHER): Payer: Medicaid Other

## 2017-01-12 DIAGNOSIS — E079 Disorder of thyroid, unspecified: Secondary | ICD-10-CM

## 2017-01-12 DIAGNOSIS — F319 Bipolar disorder, unspecified: Secondary | ICD-10-CM

## 2017-01-12 DIAGNOSIS — D6851 Activated protein C resistance: Secondary | ICD-10-CM | POA: Diagnosis not present

## 2017-01-12 DIAGNOSIS — G459 Transient cerebral ischemic attack, unspecified: Secondary | ICD-10-CM

## 2017-01-12 DIAGNOSIS — F952 Tourette's disorder: Secondary | ICD-10-CM

## 2017-01-19 ENCOUNTER — Ambulatory Visit (HOSPITAL_COMMUNITY)
Admission: RE | Admit: 2017-01-19 | Discharge: 2017-01-19 | Disposition: A | Discharge status: Home / Self Care | Payer: Medicaid Other | Source: Ambulatory Visit | Attending: Cardiology | Admitting: Cardiology

## 2017-01-19 DIAGNOSIS — E079 Disorder of thyroid, unspecified: Secondary | ICD-10-CM | POA: Diagnosis not present

## 2017-01-19 DIAGNOSIS — D6851 Activated protein C resistance: Secondary | ICD-10-CM | POA: Diagnosis not present

## 2017-01-19 DIAGNOSIS — F319 Bipolar disorder, unspecified: Secondary | ICD-10-CM | POA: Diagnosis not present

## 2017-01-19 DIAGNOSIS — F952 Tourette's disorder: Secondary | ICD-10-CM

## 2017-01-19 DIAGNOSIS — G459 Transient cerebral ischemic attack, unspecified: Secondary | ICD-10-CM

## 2017-01-20 ENCOUNTER — Encounter: Payer: Self-pay | Admitting: Family Medicine

## 2017-01-20 ENCOUNTER — Ambulatory Visit (INDEPENDENT_AMBULATORY_CARE_PROVIDER_SITE_OTHER): Payer: Medicaid Other | Admitting: Family Medicine

## 2017-01-20 VITALS — BP 119/69 | HR 62 | Ht 69.0 in | Wt 162.0 lb

## 2017-01-20 DIAGNOSIS — E079 Disorder of thyroid, unspecified: Secondary | ICD-10-CM | POA: Diagnosis not present

## 2017-01-20 DIAGNOSIS — F319 Bipolar disorder, unspecified: Secondary | ICD-10-CM

## 2017-01-20 DIAGNOSIS — G459 Transient cerebral ischemic attack, unspecified: Secondary | ICD-10-CM | POA: Diagnosis not present

## 2017-01-20 MED ORDER — LAMOTRIGINE 100 MG PO TABS: 50.0000 mg | ORAL_TABLET | Freq: Every day | ORAL | 1 refills | Status: DC

## 2017-01-20 MED ORDER — QUETIAPINE FUMARATE 50 MG PO TABS: 50.0000 mg | ORAL_TABLET | Freq: Every evening | ORAL | 2 refills | Status: DC | PRN

## 2017-01-20 NOTE — Progress Notes (Signed)
Vincent Shaw is a 21 y.o. male who presents to [facility]: Primary Care [facility] Medicine today for follow-up bipolar disorder. Patient was seen 2 weeks ago for uncontrolled bipolar disorder with a potential hypomanic episode. He was started on Lamictal and on Seroquel. He notes on 25 mg of Lamictal daily and 25 mg a Seroquel at night he is sleeping much better but still having trouble sleeping. He notes overall his mood is improving.  Additionally patient was found to have hypothyroidism was restarted on levothyroxine 75 g daily he tolerates this medication well.  Lastly had stroke symptoms with a history of hypercoagulable syndrome. He's had a workup including MRI of the brain which is unremarkable and has had a carotid Doppler study done yesterday that the results are not back for yet. He has not heard about his echocardiogram yet. He denies any further stroke symptoms.   Past Medical History:  Diagnosis Date  . ADHD (attention deficit hyperactivity disorder)   . Bipolar 1 disorder (HCC)   . Foreign body of right hand 04/2016   palm  . Hypothyroidism   . Memory impairment   . Tourette syndrome    Past Surgical History:  Procedure Laterality Date  . FOREIGN BODY REMOVAL Right 05/15/2016   Procedure: FOREIGN BODY REMOVAL right palm;  Surgeon: Betha Loa, MD;  Location: [facility];  Service: Orthopedics;  Laterality: Right;  . NO PAST SURGERIES     Social History  Substance Use Topics  . Smoking status: Light Tobacco Smoker    Packs/day: 0.25    Types: Cigarettes    Last attempt to quit: 03/03/2016  . Smokeless tobacco: Never Used  . Alcohol use No   family history includes Factor V Leiden deficiency in his paternal aunt; Hyperlipidemia in his mother; Hypertension in his mother.  ROS as above:  Medications: Current Outpatient Prescriptions  Medication Sig Dispense  Refill  . aspirin EC 81 MG tablet Take 81 mg by mouth daily as needed.     . lamoTRIgine (LAMICTAL) 100 MG tablet Take 0.5 tablets (50 mg total) by mouth daily. 30 tablet 1  . levothyroxine (SYNTHROID, LEVOTHROID) 75 MCG tablet Take 1 tablet (75 mcg total) by mouth daily before breakfast. 90 tablet 1  . QUEtiapine (SEROQUEL) 50 MG tablet Take 1 tablet (50 mg total) by mouth at bedtime as needed and may repeat dose one time if needed. 60 tablet 2   No current facility-administered medications for this visit.    Allergies  Allergen Reactions  . Risperdal [Risperidone] Other (See Comments)    DYSKINESIA    Health Maintenance Health Maintenance  Topic Date Due  . INFLUENZA VACCINE  04/08/2017 (Originally 02/25/2017)  . TETANUS/TDAP  04/08/2026  . HIV Screening  Completed     Exam:  BP 119/69   Pulse 62   Ht 5\' 9"  (1.753 m)   Wt 162 lb (73.5 kg)   BMI 23.92 kg/m  Gen: Well NAD HEENT: EOMI,  MMM Lungs: Normal work of breathing. CTABL Heart: RRR no MRG Abd: NABS, Soft. Nondistended, Nontender Exts: Brisk capillary refill,  warm and well perfused.  Psych alert and oriented normal speech thought process and affect. Neuro frequent tics no neuro deficits noted.  Review of labs show heterozygous factor V Leiden   No results found for this or any previous visit (from the past 72 hour(s)). No results found.    Assessment and Plan: 21 y.o. male with  Bipolar: Improving. Plan to titrate Lamictal to 50 mg daily and recheck in 2 weeks. Also will titrate Seroquel to 50 mg daily at bedtime.  Hypothyroid: Continue levothyroxine.  Stroke symptoms: So far workup has been unremarkable. We'll contact about echocardiogram. Recheck in 2 weeks.   No orders of the defined types were placed in this encounter.  Meds ordered this encounter  Medications  . lamoTRIgine (LAMICTAL) 100 MG tablet    Sig: Take 0.5 tablets (50 mg total) by mouth daily.    Dispense:  30 tablet    Refill:  1  .  QUEtiapine (SEROQUEL) 50 MG tablet    Sig: Take 1 tablet (50 mg total) by mouth at bedtime as needed and may repeat dose one time if needed.    Dispense:  60 tablet    Refill:  2     Discussed warning signs or symptoms. Please see discharge instructions. Patient expresses understanding.

## 2017-01-20 NOTE — Patient Instructions (Signed)
Thank you for coming in today. Recheck in 2 weeks.  Increase lamictal dose to 50mg  daily.  Increase Seraquel dose to 50mg  at night.   You should hear about the ultrasound of the hear soon.

## 2017-01-22 ENCOUNTER — Ambulatory Visit (INDEPENDENT_AMBULATORY_CARE_PROVIDER_SITE_OTHER): Payer: Medicaid Other | Admitting: Licensed Clinical Social Worker

## 2017-01-22 DIAGNOSIS — F439 Reaction to severe stress, unspecified: Secondary | ICD-10-CM | POA: Diagnosis not present

## 2017-01-22 DIAGNOSIS — G4700 Insomnia, unspecified: Secondary | ICD-10-CM | POA: Diagnosis not present

## 2017-01-22 DIAGNOSIS — F952 Tourette's disorder: Secondary | ICD-10-CM

## 2017-01-22 NOTE — Progress Notes (Signed)
   THERAPIST PROGRESS NOTE  Session Time: 11:00am-12:00pm  Participation Level: Active  Behavioral Response:    Casual  Alert   Blunted affect  Fleeting eye contact  Type of Therapy: Individual therapy  Treatment Goals addressed: Decrease feelings of shame, accept and cope effectively with distressing feelings  Interventions: Assessment, Treatment planning   Suicidal/Homicidal: Denied both  Therapist Interventions:  Gathered information about significant events and changes in mood and functioning since last seen at the end of January.  Asked patient why he did not schedule any appointments for so long and what motivated him to come back.   Had patient complete a PCL-5 to assess for PTSD-related symptoms.  Interpreted results. Collaborated with patient to update his treatment plan.  Prompted identification of strengths and supports.  When patient was not able to articulate what it is he would like to work on in therapy, suggested some goals.     Summary:  Reported that he had been working at [employer] since the end of February, but was fired about a month ago.  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  York SpanielSaid that his boss was not very understanding when it came to workers taking time off to attend appointments.  Admitted to feeling angry about the circumstances which led to him being fired.  He had a mini-stroke at work.  When he told the manager about it, the manager expected him to continue working.  Patient got angry and clocked out of work.  He went to urgent care for treatment.  It was confirmed he had experienced a TIA.  After seeking medical attention he called his boss to explain the situation.  His boss hung up on him.  Patient called back and cussed him out.         Score on the PCL-5 was only 24.  He denied being bothered by reminders of past trauma in the form of intrusive thoughts, nightmares, or flashbacks.  Also denied experiencing distressing  feelings upon coming into contact with something that reminds him of traumatic events.  The items he rated as bothering him quite a bit or extremely were: Having strong negative feelings (fear, horror, anger, guilt, or shame) Feeling distant or cut off from others Hypervigilance Trouble falling or staying asleep  Developed the following treatment goals: Antwon will report a significant decrease in feelings of shame.  He will be willing to acknowledge his experience of having distressing feelings and report feeling satisfied with how he is coping with those distressing feelings.  Therapist suggested these goals because patient seems to put on the appearance that he is fine when inside he is actually experiencing quite a bit of distressing feelings.  Instead of avoiding those feelings he needs to find a way to accept them and cope with them effectively.       Plan:   Scheduled to return July 17th.  May educate patient about shame.  Diagnosis:  Trauma and Stressor Related Disorder                                               Darrin LuisSolomon, Sarah A, LCSW 01/22/2017

## 2017-02-02 ENCOUNTER — Encounter: Payer: Self-pay | Admitting: Family Medicine

## 2017-02-02 ENCOUNTER — Ambulatory Visit (INDEPENDENT_AMBULATORY_CARE_PROVIDER_SITE_OTHER): Payer: Medicaid Other | Admitting: Family Medicine

## 2017-02-02 VITALS — BP 116/71 | HR 65 | Wt 158.0 lb

## 2017-02-02 DIAGNOSIS — E079 Disorder of thyroid, unspecified: Secondary | ICD-10-CM

## 2017-02-02 DIAGNOSIS — F319 Bipolar disorder, unspecified: Secondary | ICD-10-CM

## 2017-02-02 DIAGNOSIS — F952 Tourette's disorder: Secondary | ICD-10-CM | POA: Diagnosis not present

## 2017-02-02 MED ORDER — QUETIAPINE FUMARATE 50 MG PO TABS: 50.0000 mg | ORAL_TABLET | Freq: Every day | ORAL | 1 refills | Status: DC

## 2017-02-02 MED ORDER — LAMOTRIGINE 100 MG PO TABS: 100.0000 mg | ORAL_TABLET | Freq: Every day | ORAL | 1 refills | Status: DC

## 2017-02-02 NOTE — Patient Instructions (Addendum)
Thank you for coming in today. Get labs in 2 weeks.  Recheck with me in about 1 month to follow up mood.  If things are not going well see me sooner.  Increase Lamictal to 100mg  daily.  Continue Seroquel at night.   Lamotrigine tablets What is this medicine? LAMOTRIGINE (la MOE Patrecia Pace) is used to control seizures in adults and children with epilepsy and Lennox-Gastaut syndrome. It is also used in adults to treat bipolar disorder. This medicine may be used for other purposes; ask your health care provider or pharmacist if you have questions. COMMON BRAND NAME(S): Lamictal What should I tell my health care provider before I take this medicine? They need to know if you have any of these conditions: -a history of depression or bipolar disorder -aseptic meningitis during prior use of lamotrigine -folate deficiency -kidney disease -liver disease -suicidal thoughts, plans, or attempt; a previous suicide attempt by you or a family member -an unusual or allergic reaction to lamotrigine or other seizure medications, other medicines, foods, dyes, or preservatives -pregnant or trying to get pregnant -breast-feeding How should I use this medicine? Take this medicine by mouth with a glass of water. Follow the directions on the prescription label. Do not chew these tablets. If this medicine upsets your stomach, take it with food or milk. Take your doses at regular intervals. Do not take your medicine more often than directed. A special MedGuide will be given to you by the pharmacist with each new prescription and refill. Be sure to read this information carefully each time. Talk to your pediatrician regarding the use of this medicine in children. While this drug may be prescribed for children as young as 2 years for selected conditions, precautions do apply. Overdosage: If you think you have taken too much of this medicine contact a poison control center or emergency room at once. NOTE: This medicine  is only for you. Do not share this medicine with others. What if I miss a dose? If you miss a dose, take it as soon as you can. If it is almost time for your next dose, take only that dose. Do not take double or extra doses. What may interact with this medicine? -carbamazepine -male hormones, including contraceptive or birth control pills -methotrexate -phenobarbital -phenytoin -primidone -pyrimethamine -rifampin -trimethoprim -valproic acid This list may not describe all possible interactions. Give your health care provider a list of all the medicines, herbs, non-prescription drugs, or dietary supplements you use. Also tell them if you smoke, drink alcohol, or use illegal drugs. Some items may interact with your medicine. What should I watch for while using this medicine? Visit your doctor or health care professional for regular checks on your progress. If you take this medicine for seizures, wear a Medic Alert bracelet or necklace. Carry an identification card with information about your condition, medicines, and doctor or health care professional. It is important to take this medicine exactly as directed. When first starting treatment, your dose will need to be adjusted slowly. It may take weeks or months before your dose is stable. You should contact your doctor or health care professional if your seizures get worse or if you have any new types of seizures. Do not stop taking this medicine unless instructed by your doctor or health care professional. Stopping your medicine suddenly can increase your seizures or their severity. Contact your doctor or health care professional right away if you develop a rash while taking this medicine. Rashes may be very  severe and sometimes require treatment in the hospital. Deaths from rashes have occurred. Serious rashes occur more often in children than adults taking this medicine. It is more common for these serious rashes to occur during the first 2  months of treatment, but a rash can occur at any time. You may get drowsy, dizzy, or have blurred vision. Do not drive, use machinery, or do anything that needs mental alertness until you know how this medicine affects you. To reduce dizzy or fainting spells, do not sit or stand up quickly, especially if you are an older patient. Alcohol can increase drowsiness and dizziness. Avoid alcoholic drinks. If you are taking this medicine for bipolar disorder, it is important to report any changes in your mood to your doctor or health care professional. If your condition gets worse, you get mentally depressed, feel very hyperactive or manic, have difficulty sleeping, or have thoughts of hurting yourself or committing suicide, you need to get help from your health care professional right away. If you are a caregiver for someone taking this medicine for bipolar disorder, you should also report these behavioral changes right away. The use of this medicine may increase the chance of suicidal thoughts or actions. Pay special attention to how you are responding while on this medicine. Your mouth may get dry. Chewing sugarless gum or sucking hard candy, and drinking plenty of water may help. Contact your doctor if the problem does not go away or is severe. Women who become pregnant while using this medicine may enroll in the [place] Antiepileptic Drug Pregnancy Registry by calling [phone]. This registry collects information about the safety of antiepileptic drug use during pregnancy. What side effects may I notice from receiving this medicine? Side effects that you should report to your doctor or health care professional as soon as possible: -allergic reactions like skin rash, itching or hives, swelling of the face, lips, or tongue -blurred or double vision -difficulty walking or controlling muscle movements -fever -headache, stiff neck, and sensitivity to light -painful sores in the mouth, eyes, or  nose -redness, blistering, peeling or loosening of the skin, including inside the mouth -severe muscle pain -swollen lymph glands -uncontrollable eye movements -unusual bruising or bleeding -unusually weak or tired -vomiting -worsening of mood, thoughts or actions of suicide or dying -yellowing of the eyes or skin Side effects that usually do not require medical attention (report to your doctor or health care professional if they continue or are bothersome): -diarrhea or constipation -difficulty sleeping -nausea -tremors This list may not describe all possible side effects. Call your doctor for medical advice about side effects. You may report side effects to FDA at 1-800-FDA-1088. Where should I keep my medicine? Keep out of reach of children. Store at room temperature between 15 and 30 degrees C (59 and 86 degrees F). Throw away any unused medicine after the expiration date. NOTE: This sheet is a summary. It may not cover all possible information. If you have questions about this medicine, talk to your doctor, pharmacist, or health care provider.  2018 Elsevier/Gold Standard (2015-08-16 09:29:40)

## 2017-02-02 NOTE — Progress Notes (Signed)
Vincent Shaw is a 21 y.o. male who presents to [facility]: [facility] today for follow-up bipolar disorder hypothyroidism and Tourette's.  Bipolar: Patient has been slowly titrating but the Seroquel and Lamictal. He currently takes 50 mg of Lamictal during the day and 50 mg a Seroquel at night. He notes that he is tolerating both medications well and is sleeping quite well. He feels recently well overall. He does note that he feels like he is missing some be central mineral or element in his body. He doesn't think it's thyroid or anemia. Would like to improve his diet in an effort to be more healthy. He denies any SI or HI.  Hypothyroidism: Patient continues to take 75 g of levothyroxine daily. He's been back on this medication now for about 4 weeks. He is feeling reasonably well and not feeling too hot or too cold.  Reds: Patient continues to have a tic disorder. He notes he does not find it to be especially obnoxious. He is not currently taking any medication for this.   Past Medical History:  Diagnosis Date  . ADHD (attention deficit hyperactivity disorder)   . Bipolar 1 disorder (HCC)   . Foreign body of right hand 04/2016   palm  . Hypothyroidism   . Memory impairment   . Tourette syndrome    Past Surgical History:  Procedure Laterality Date  . FOREIGN BODY REMOVAL Right 05/15/2016   Procedure: FOREIGN BODY REMOVAL right palm;  Surgeon: Betha Loa, MD;  Location: [facility];  Service: Orthopedics;  Laterality: Right;  . NO PAST SURGERIES     Social History  Substance Use Topics  . Smoking status: Heavy Tobacco Smoker    Packs/day: 2.00    Years: 0.30    Types: Cigarettes    Last attempt to quit: 03/03/2016  . Smokeless tobacco: Never Used  . Alcohol use No   family history includes Factor V Leiden deficiency in his paternal aunt; Hyperlipidemia in  his mother; Hypertension in his mother.  ROS as above:  Medications: Current Outpatient Prescriptions  Medication Sig Dispense Refill  . aspirin EC 81 MG tablet Take 81 mg by mouth daily as needed.     . lamoTRIgine (LAMICTAL) 100 MG tablet Take 1 tablet (100 mg total) by mouth daily. 90 tablet 1  . levothyroxine (SYNTHROID, LEVOTHROID) 75 MCG tablet Take 1 tablet (75 mcg total) by mouth daily before breakfast. 90 tablet 1  . QUEtiapine (SEROQUEL) 50 MG tablet Take 1 tablet (50 mg total) by mouth at bedtime. 90 tablet 1   No current facility-administered medications for this visit.    Allergies  Allergen Reactions  . Risperdal [Risperidone] Other (See Comments)    DYSKINESIA    Health Maintenance Health Maintenance  Topic Date Due  . INFLUENZA VACCINE  04/08/2017 (Originally 02/25/2017)  . TETANUS/TDAP  04/08/2026  . HIV Screening  Completed     Exam:  BP 116/71   Pulse 65   Wt 158 lb (71.7 kg)   SpO2 96%   BMI 23.33 kg/m   Wt Readings from Last 10 Encounters:  02/02/17 158 lb (71.7 kg)  01/20/17 162 lb (73.5 kg)  01/06/17 164 lb (74.4 kg)  12/20/16 163 lb 12 oz (74.3 kg)  11/26/16 160 lb (72.6 kg)  07/08/16 160 lb (72.6 kg)  05/15/16 156 lb (70.8 kg)  04/08/16 157 lb (71.2 kg)  06/14/15 158 lb (71.7 kg)  Gen: Well NAD HEENT: EOMI,  MMM Lungs: Normal work of breathing. CTABL Heart: RRR no MRG Abd: NABS, Soft. Nondistended, Nontender Exts: Brisk capillary refill, warm and well perfused.  Psych alert and oriented normal speech. Thought process is slightly tangential back to the mineral deficiency. Affect is normal. No SI or HI expressed. Patient does not appear to be responding to hallucinations. No obvious delusions.   No results found for this or any previous visit (from the past 72 hour(s)). No results found.    Assessment and Plan: 21 y.o. male with  Bipolar: Patient seems to be doing pretty well. Plan to continue titrating Lamictal to 100 mg. Keep  Seroquel at 50 mg seems to be doing okay on this dose and is sleeping well. The presumed mineral deficiency may be a delusion. He does not appear to be expressing any harmful thoughts. We'll keep an eye on this. Follow-up with therapy. Recheck in one month.  Hypothyroidism: Plan to recheck TSH free T4 and T3 in about 2 weeks. Return to clinic in 4 weeks continue levothyroxine 75.  Tourette/tick: Doing well continue to follow.   Orders Placed This Encounter  Procedures  . T4, free  . T3, free  . TSH   Meds ordered this encounter  Medications  . lamoTRIgine (LAMICTAL) 100 MG tablet    Sig: Take 1 tablet (100 mg total) by mouth daily.    Dispense:  90 tablet    Refill:  1  . QUEtiapine (SEROQUEL) 50 MG tablet    Sig: Take 1 tablet (50 mg total) by mouth at bedtime.    Dispense:  90 tablet    Refill:  1     Discussed warning signs or symptoms. Please see discharge instructions. Patient expresses understanding.

## 2017-02-03 ENCOUNTER — Ambulatory Visit: Payer: Self-pay | Admitting: Family Medicine

## 2017-02-04 ENCOUNTER — Ambulatory Visit (HOSPITAL_BASED_OUTPATIENT_CLINIC_OR_DEPARTMENT_OTHER)
Admission: RE | Admit: 2017-02-04 | Discharge: 2017-02-04 | Disposition: A | Discharge status: Home / Self Care | Payer: Medicaid Other | Source: Ambulatory Visit | Attending: Family Medicine | Admitting: Family Medicine

## 2017-02-04 DIAGNOSIS — F952 Tourette's disorder: Secondary | ICD-10-CM | POA: Insufficient documentation

## 2017-02-04 DIAGNOSIS — E079 Disorder of thyroid, unspecified: Secondary | ICD-10-CM | POA: Insufficient documentation

## 2017-02-04 DIAGNOSIS — G459 Transient cerebral ischemic attack, unspecified: Secondary | ICD-10-CM | POA: Diagnosis present

## 2017-02-04 DIAGNOSIS — F319 Bipolar disorder, unspecified: Secondary | ICD-10-CM | POA: Insufficient documentation

## 2017-02-04 DIAGNOSIS — D6851 Activated protein C resistance: Secondary | ICD-10-CM | POA: Insufficient documentation

## 2017-02-10 ENCOUNTER — Ambulatory Visit (INDEPENDENT_AMBULATORY_CARE_PROVIDER_SITE_OTHER): Payer: Medicaid Other | Admitting: Licensed Clinical Social Worker

## 2017-02-10 DIAGNOSIS — F439 Reaction to severe stress, unspecified: Secondary | ICD-10-CM | POA: Diagnosis not present

## 2017-02-10 NOTE — Progress Notes (Signed)
   THERAPIST PROGRESS NOTE  Session Time: 1:03pm-1:58pm  Participation Level: Active  Behavioral Response:    Casual  Alert  Somewhat dysphoric   Type of Therapy: Individual therapy  Treatment Goals addressed: Decrease feelings of shame, accept and cope effectively with distressing feelings  Interventions: Psycho-ed about shame  Suicidal/Homicidal: Denied both  Therapist Interventions:  The focus for the session today was on shame.  Provided a definition.  Discussed how people generally don't talk about their feelings of shame.  Suggested that it is actually healthy to talk about it with someone who can provide empathy.  Had patient complete an exercise called The Physiology of Shame.  Reviewed his answers.  Explained some of the similarities and differences between shame, guilt, humiliation, and embarrassment.          Summary: Experiencing some ups and downs in his current relationship.  Describes his girlfriend as being "insecure."  Notes that she has a history of trauma so it is hard for her to trust others, especially men.  She goes back and forth about whether or not to end the relationship.   Reported that he forgot about his [facility] [employer] [facility] other [facility].  He overslept.  Planning to go there later today and talk to someone about his application. Cooperative about completing the exercise about shame.  Admitted that he does experience feelings of shame fairly often but he never talks about those feelings.       Plan:   Will expand on the topic of shame at next session.    Diagnosis:  Trauma and Stressor Related Disorder                                               Darrin LuisSolomon, Sarah A, LCSW 02/10/2017

## 2017-02-25 ENCOUNTER — Ambulatory Visit (INDEPENDENT_AMBULATORY_CARE_PROVIDER_SITE_OTHER): Payer: Medicaid Other | Admitting: Licensed Clinical Social Worker

## 2017-02-25 DIAGNOSIS — F439 Reaction to severe stress, unspecified: Secondary | ICD-10-CM
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
## 2017-03-02 ENCOUNTER — Encounter: Payer: Self-pay | Admitting: Family Medicine

## 2017-03-02 ENCOUNTER — Ambulatory Visit (INDEPENDENT_AMBULATORY_CARE_PROVIDER_SITE_OTHER): Payer: Medicaid Other | Admitting: Family Medicine

## 2017-03-02 VITALS — BP 110/57 | HR 53 | Wt 158.0 lb

## 2017-03-02 DIAGNOSIS — E079 Disorder of thyroid, unspecified: Secondary | ICD-10-CM | POA: Diagnosis not present

## 2017-03-02 DIAGNOSIS — F319 Bipolar disorder, unspecified: Secondary | ICD-10-CM

## 2017-03-02 MED ORDER — QUETIAPINE FUMARATE 100 MG PO TABS: 100.0000 mg | ORAL_TABLET | Freq: Every day | ORAL | 1 refills | Status: DC

## 2017-03-02 NOTE — Patient Instructions (Addendum)
Thank you for coming in today. Increase the Seroquel to 100mg  at night.  If not enough we can go to 200. Let me know if you need a different prescription.  Please schedule a follow up visit with the psychiatrist downstairs as well.  See me in 1 month.   We will go from there.   We will get thyroid labs today.

## 2017-03-02 NOTE — Progress Notes (Signed)
Vincent Shaw is a 21 y.o. male who presents to [facility] today for follow-up mood and thyroid disorder.   Mood: Patient has a history of bipolar disorder. He currently takes 100 mg of Lamictal daily and 50 mg of Seroquel at night. He notes he continues to have difficulty sleeping and notes that he is not taking the Seroquel consistently. He has followed up with LCSW she has found helpful. He has not followed up with psychiatry. He notes continued anxiety symptoms but denies any depression symptoms. He denies any SI or HI hallucinations.  Thyroid disorder: Patient continues to take 75 g of levothyroxine daily. He denies feeling too cold or too hot.   Past Medical History:  Diagnosis Date  . ADHD (attention deficit hyperactivity disorder)   . Bipolar 1 disorder (HCC)   . Foreign body of right hand 04/2016   palm  . Hypothyroidism   . Memory impairment   . Tourette syndrome    Past Surgical History:  Procedure Laterality Date  . FOREIGN BODY REMOVAL Right 05/15/2016   Procedure: FOREIGN BODY REMOVAL right palm;  Surgeon: Betha Loa, MD;  Location: [facility];  Service: Orthopedics;  Laterality: Right;  . NO PAST SURGERIES     Social History  Substance Use Topics  . Smoking status: Heavy Tobacco Smoker    Packs/day: 2.00    Years: 0.30    Types: Cigarettes    Last attempt to quit: 03/03/2016  . Smokeless tobacco: Never Used  . Alcohol use No   family history includes Factor V Leiden deficiency in his paternal aunt; Hyperlipidemia in his mother; Hypertension in his mother.  ROS as above:  Medications: Current Outpatient Prescriptions  Medication Sig Dispense Refill  . aspirin EC 81 MG tablet Take 81 mg by mouth daily as needed.     . lamoTRIgine (LAMICTAL) 100 MG tablet Take 1 tablet (100 mg total) by mouth daily. 90 tablet 1  .  levothyroxine (SYNTHROID, LEVOTHROID) 75 MCG tablet Take 1 tablet (75 mcg total) by mouth daily before breakfast. 90 tablet 1  . QUEtiapine (SEROQUEL) 100 MG tablet Take 1 tablet (100 mg total) by mouth at bedtime. 30 tablet 1   No current facility-administered medications for this visit.    Allergies  Allergen Reactions  . Risperdal [Risperidone] Other (See Comments)    DYSKINESIA    Health Maintenance Health Maintenance  Topic Date Due  . INFLUENZA VACCINE  04/08/2017 (Originally 02/25/2017)  . TETANUS/TDAP  04/08/2026  . HIV Screening  Completed     Exam:  BP (!) 110/57   Pulse (!) 53   Wt 158 lb (71.7 kg)   BMI 23.33 kg/m  Gen: Well NAD HEENT: EOMI,  MMM Lungs: Normal work of breathing. CTABL Heart: RRR no MRG Abd: NABS, Soft. Nondistended, Nontender Exts: Brisk capillary refill, warm and well perfused.  Psych: Alert and oriented normal speech interrupted by ticks. Thought processes linear and goal-directed. No SI or HI expressed.  Depression screen [facility] 2/9 03/03/2017 03/02/2017 02/02/2017 04/01/2016  Decreased Interest 0 0 0 3  Down, Depressed, Hopeless 0 0 0 0  PHQ - 2 Score 0 0 0 3  Altered sleeping 3 3 1 3   Tired, decreased energy 0 0 0 3  Change in appetite 0 0 1 3  Feeling bad or failure about yourself  1 1 0 0  Trouble concentrating 0 0 0 0  Moving slowly or  fidgety/restless 0 0 0 3  Suicidal thoughts 0 0 0 0  PHQ-9 Score 4 4 2 15   Difficult doing work/chores - - - Extremely dIfficult   GAD 7 : Generalized Anxiety Score 03/03/2017 03/02/2017 02/02/2017 04/01/2016  Nervous, Anxious, on Edge 0 0 0 3  Control/stop worrying 2 2 0 3  Worry too much - different things 3 3 0 3  Trouble relaxing 3 3 0 3  Restless 3 3 0 3  Easily annoyed or irritable 3 3 0 3  Afraid - awful might happen 2 2 0 0  Total GAD 7 Score 16 16 0 18  Anxiety Difficulty Somewhat difficult Somewhat difficult - Extremely difficult       No results found for this or any previous visit (from the past  72 hour(s)). No results found.    Assessment and Plan: 21 y.o. male with  Mood disorder with insomnia. Plan increase Seroquel 200 mg. Additionally recommend patient follow-up with psychiatry. Continue counseling. Recheck in one month.  Hypothyroidism: Awaiting results of TSH that was ordered recently. We'll adjust dose based on results.   No orders of the defined types were placed in this encounter.  Meds ordered this encounter  Medications  . QUEtiapine (SEROQUEL) 100 MG tablet    Sig: Take 1 tablet (100 mg total) by mouth at bedtime.    Dispense:  30 tablet    Refill:  1     Discussed warning signs or symptoms. Please see discharge instructions. Patient expresses understanding.

## 2017-03-04 LAB — T3, FREE: T3 FREE: 3.9 pg/mL (ref 2.3–4.2)

## 2017-03-04 LAB — T4, FREE: Free T4: 1.3 ng/dL (ref 0.8–1.8)

## 2017-03-04 LAB — TSH: TSH: 2.81 m[IU]/L (ref 0.40–4.50)

## 2017-03-05 ENCOUNTER — Other Ambulatory Visit: Payer: Self-pay | Admitting: Family Medicine

## 2017-03-05 MED ORDER — LEVOTHYROXINE SODIUM 88 MCG PO TABS: 88.0000 ug | ORAL_TABLET | Freq: Every day | ORAL | 1 refills | Status: DC

## 2017-03-05 MED ORDER — LEVOTHYROXINE SODIUM 75 MCG PO TABS: 75.0000 ug | ORAL_TABLET | Freq: Every day | ORAL | 1 refills | Status: DC

## 2017-03-16 ENCOUNTER — Ambulatory Visit (HOSPITAL_COMMUNITY): Payer: Medicaid Other | Admitting: Licensed Clinical Social Worker

## 2017-03-17 ENCOUNTER — Ambulatory Visit (HOSPITAL_COMMUNITY): Payer: Medicaid Other | Admitting: Licensed Clinical Social Worker

## 2017-03-19 ENCOUNTER — Ambulatory Visit (HOSPITAL_COMMUNITY): Payer: Medicaid Other | Admitting: Medical

## 2017-04-02 ENCOUNTER — Ambulatory Visit (INDEPENDENT_AMBULATORY_CARE_PROVIDER_SITE_OTHER): Payer: Medicaid Other | Admitting: Family Medicine

## 2017-04-02 ENCOUNTER — Encounter: Payer: Self-pay | Admitting: Family Medicine

## 2017-04-02 VITALS — BP 98/56 | HR 55 | Wt 157.0 lb

## 2017-04-02 DIAGNOSIS — Z5900 Homelessness unspecified: Secondary | ICD-10-CM | POA: Insufficient documentation

## 2017-04-02 DIAGNOSIS — F319 Bipolar disorder, unspecified: Secondary | ICD-10-CM

## 2017-04-02 MED ORDER — QUETIAPINE FUMARATE 25 MG PO TABS: 25.0000 mg | ORAL_TABLET | Freq: Every day | ORAL | 1 refills | Status: DC

## 2017-04-02 NOTE — Progress Notes (Signed)
Vincent Shaw is a 21 y.o. male who [facility] today for follow-up mood and bipolar disorder. Patient has been managed recently for bipolar and was doing pretty well. However he had a suicidal gesture or attempt in the interval. He broke up with his girlfriend and took a bunch of his medications. Since that he is feeling well and denies any suicidal ideation. He currently takes 25 mg of Seroquel at night and is able to sleep and takes Lamictal 100 mg during the day. He's feeling reasonably well.   However his social situation is worsening. He is about to lose his apartment he'll be homeless in about 3 weeks unless he can find some way to pay for rent.  He has some family support but nowhere to live.   Past Medical History:  Diagnosis Date  . ADHD (attention deficit hyperactivity disorder)   . Bipolar 1 disorder (HCC)   . Foreign body of right hand 04/2016   palm  . Hypothyroidism   . Memory impairment   . Tourette syndrome    Past Surgical History:  Procedure Laterality Date  . FOREIGN BODY REMOVAL Right 05/15/2016   Procedure: FOREIGN BODY REMOVAL right palm;  Surgeon: Vincent LoaKevin Kuzma, MD;  Location: [facility];  Service: Orthopedics;  Laterality: Right;  . NO PAST SURGERIES     Social History  Substance Use Topics  . Smoking status: Heavy Tobacco Smoker    Packs/day: 2.00    Years: 0.30    Types: Cigarettes    Last attempt to quit: 03/03/2016  . Smokeless tobacco: Never Used  . Alcohol use No   family history includes Factor V Leiden deficiency in his paternal aunt; Hyperlipidemia in his mother; Hypertension in his mother.  ROS as above:  Medications: Current Outpatient Prescriptions  Medication Sig Dispense Refill  . aspirin EC 81 MG tablet Take 81 mg by mouth daily as needed.     . lamoTRIgine (LAMICTAL) 100 MG tablet Take 1  tablet (100 mg total) by mouth daily. 90 tablet 1  . levothyroxine (SYNTHROID, LEVOTHROID) 75 MCG tablet Take 1 tablet (75 mcg total) by mouth daily before breakfast. 90 tablet 1  . QUEtiapine (SEROQUEL) 25 MG tablet Take 1 tablet (25 mg total) by mouth at bedtime. 30 tablet 1   No current facility-administered medications for this visit.    Allergies  Allergen Reactions  . Risperdal [Risperidone] Other (See Comments)    DYSKINESIA    Health Maintenance Health Maintenance  Topic Date Due  . INFLUENZA VACCINE  04/08/2017 (Originally 02/25/2017)  . TETANUS/TDAP  04/08/2026  . HIV Screening  Completed     Exam:  BP (!) 98/56   Pulse (!) 55   Wt 157 lb (71.2 kg)   BMI 23.18 kg/m  Gen: Well NAD HEENT: EOMI,  MMM Lungs: Normal work of breathing. CTABL Heart: RRR no MRG Abd: NABS, Soft. Nondistended, Nontender Exts: Brisk capillary refill, warm and well perfused.  Psych alert and oriented normal speech thought process and affect. No SI or HI expressed. Neuro: Frequent tics  [facility] 2/9 04/02/2017 03/03/2017 03/02/2017 02/02/2017  Decreased Interest 3 0 0 0  Down, Depressed, Hopeless 3 0 0 0  PHQ - 2 Score 6 0 0 0  Altered sleeping 2 3 3 1   Tired, decreased energy 0 0 0 0  Change in appetite 0 0 0 1  Feeling bad or failure about  yourself  0  Trouble concentrating 0 0 0 0  Moving slowly or fidgety/restless 0 0 0 0  Suicidal thoughts 0 0 0 0  PHQ-9 Score Some encounter information is confidential and restricted. Go to Review Flowsheets activity to see all data.      No results found for this or any previous visit (from the past 72 hour(s)). No results found.    Assessment and Plan: 21 y.o. male with  Bipolar disorder: Reasonably well-controlled. Continue Lamictal 100 and Seroquel 25. Discuss suicidal precautions. Recheck in one month.  Poor social situation: I don't have a good solution for [name]'s impending homelessness. Plan to refer for  social work/case management to see if there is any services he may be eligible for. Recheck in a month.  Orders Placed This Encounter  Procedures  . Ambulatory referral to Social Work    Referral Priority:   Routine    Referral Type:   Consultation    Referral Reason:   Specialty Services Required    Number of Visits Requested:   1   Meds ordered this encounter  Medications  . QUEtiapine (SEROQUEL) 25 MG tablet    Sig: Take 1 tablet (25 mg total) by mouth at bedtime.    Dispense:  30 tablet    Refill:  1     Discussed warning signs or symptoms. Please see discharge instructions. Patient expresses understanding.   I spent 25 minutes with this patient, greater than 50% was face-to-face time counseling regarding treatment options and plan an homelessness situation.Marland Kitchen

## 2017-04-02 NOTE — Patient Instructions (Signed)
Thank you for coming in today. Continue current medications.  Recheck with me in 1 month.  Be proactive about housing. Let me know if you get worse we will always get you in ASAP.

## 2017-04-12 ENCOUNTER — Encounter: Payer: Self-pay | Admitting: Emergency Medicine

## 2017-04-12 ENCOUNTER — Emergency Department
Admission: EM | Admit: 2017-04-12 | Discharge: 2017-04-12 | Disposition: A | Discharge status: Home / Self Care | Payer: Medicaid Other | Source: Home / Self Care | Attending: Family Medicine | Admitting: Family Medicine

## 2017-04-12 DIAGNOSIS — L739 Follicular disorder, unspecified: Secondary | ICD-10-CM

## 2017-04-12 MED ORDER — DOXYCYCLINE HYCLATE 100 MG PO CAPS: 100.0000 mg | ORAL_CAPSULE | Freq: Two times a day (BID) | ORAL | 0 refills | Status: DC

## 2017-04-12 NOTE — Discharge Instructions (Signed)
May apply warm compress for about 10 minutes, 2 or 3 times daily.

## 2017-04-12 NOTE — ED Triage Notes (Signed)
Patient complaining of rash on upper lip since last night, no itching, just irritation
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
## 2017-04-30 ENCOUNTER — Ambulatory Visit (INDEPENDENT_AMBULATORY_CARE_PROVIDER_SITE_OTHER): Payer: Medicaid Other | Admitting: Family Medicine

## 2017-04-30 ENCOUNTER — Encounter: Payer: Self-pay | Admitting: Family Medicine

## 2017-04-30 DIAGNOSIS — F319 Bipolar disorder, unspecified: Secondary | ICD-10-CM

## 2017-04-30 MED ORDER — QUETIAPINE FUMARATE 25 MG PO TABS: 25.0000 mg | ORAL_TABLET | Freq: Every day | ORAL | 1 refills | Status: DC
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
## 2017-04-30 NOTE — Patient Instructions (Addendum)
Thank you for coming in today. Continue the lamical and seroquel.  Continue levothyroixine.  Recheck with me in 1 month.

## 2017-06-01 ENCOUNTER — Ambulatory Visit: Payer: Self-pay | Admitting: Family Medicine

## 2017-06-02 ENCOUNTER — Emergency Department
Admission: EM | Admit: 2017-06-02 | Discharge: 2017-06-02 | Disposition: A | Discharge status: Home / Self Care | Payer: Medicaid Other | Source: Home / Self Care | Attending: Family Medicine | Admitting: Family Medicine

## 2017-06-02 ENCOUNTER — Encounter: Payer: Self-pay | Admitting: *Deleted

## 2017-06-02 DIAGNOSIS — L247 Irritant contact dermatitis due to plants, except food: Secondary | ICD-10-CM

## 2017-06-02 MED ORDER — PREDNISONE 20 MG PO TABS: ORAL_TABLET | ORAL | 0 refills | Status: DC
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
## 2017-06-02 NOTE — ED Triage Notes (Signed)
Pt c/o itching rash on his arms, neck, upper lip and jaw x 4-5 days after cutting wood.

## 2017-06-02 NOTE — Discharge Instructions (Signed)
May take an antihistamine such as Zyrtec or Allegra if needed for itching.

## 2017-06-28 ENCOUNTER — Encounter: Payer: Self-pay | Admitting: Emergency Medicine

## 2017-06-28 ENCOUNTER — Other Ambulatory Visit: Payer: Self-pay

## 2017-06-28 ENCOUNTER — Emergency Department (INDEPENDENT_AMBULATORY_CARE_PROVIDER_SITE_OTHER)
Admission: EM | Admit: 2017-06-28 | Discharge: 2017-06-28 | Disposition: A | Discharge status: Home / Self Care | Payer: Medicaid Other | Source: Home / Self Care | Attending: Family Medicine | Admitting: Family Medicine

## 2017-06-28 DIAGNOSIS — J069 Acute upper respiratory infection, unspecified: Secondary | ICD-10-CM

## 2017-06-28 DIAGNOSIS — B9789 Other viral agents as the cause of diseases classified elsewhere: Secondary | ICD-10-CM

## 2017-06-28 DIAGNOSIS — W57XXXA Bitten or stung by nonvenomous insect and other nonvenomous arthropods, initial encounter: Secondary | ICD-10-CM | POA: Diagnosis not present

## 2017-06-28 MED ORDER — TRIAMCINOLONE ACETONIDE 0.1 % EX CREA: 1.0000 "application " | TOPICAL_CREAM | Freq: Two times a day (BID) | CUTANEOUS | 0 refills | Status: DC

## 2017-06-28 MED ORDER — CETIRIZINE HCL 10 MG PO TABS: 10.0000 mg | ORAL_TABLET | Freq: Every day | ORAL | 0 refills | Status: DC
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
## 2017-06-28 NOTE — ED Triage Notes (Signed)
Patient found reddened areas on right lebow and left upper arm this morning after spending night in motel; also reports congestion for past 4 days.

## 2017-09-01 ENCOUNTER — Ambulatory Visit (INDEPENDENT_AMBULATORY_CARE_PROVIDER_SITE_OTHER): Payer: Medicaid Other | Admitting: Family Medicine

## 2017-09-01 ENCOUNTER — Encounter: Payer: Self-pay | Admitting: Family Medicine

## 2017-09-01 VITALS — BP 118/71 | HR 71 | Ht 69.0 in | Wt 164.0 lb

## 2017-09-01 DIAGNOSIS — I2699 Other pulmonary embolism without acute cor pulmonale: Secondary | ICD-10-CM

## 2017-09-01 DIAGNOSIS — D6851 Activated protein C resistance: Secondary | ICD-10-CM | POA: Diagnosis not present

## 2017-09-01 DIAGNOSIS — Z86711 Personal history of pulmonary embolism: Secondary | ICD-10-CM | POA: Insufficient documentation

## 2017-09-01 HISTORY — DX: Personal history of pulmonary embolism: Z86.711

## 2017-09-01 MED ORDER — LEVOTHYROXINE SODIUM 75 MCG PO TABS: 75.0000 ug | ORAL_TABLET | Freq: Every day | ORAL | 1 refills | Status: DC

## 2017-09-01 MED ORDER — RIVAROXABAN 20 MG PO TABS: 20.0000 mg | ORAL_TABLET | Freq: Every day | ORAL | 1 refills | Status: DC

## 2017-09-01 NOTE — Progress Notes (Signed)
Vincent Shaw is a 22 y.o. male who presents to [facility]: Primary Care Sports Medicine today for follow-up after suffering PE 08/08/17. 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 Presumed to be unprovoked PE. They did not identify source location of PE, and patient cannot say if he was having arm or leg swelling or pain. Patient was given 30 day course of Xarelto. He was not hospitalized.   Since that time, patient has been taking Xarelto daily. Having no complications with medication, though has felt increasingly fatigued recently. No shortness of breath, arm, or leg swelling per patient. Patient concerned about ability to work on medication, but this should not be an issue. Patient hesitant to be on medication indefinitely, so we will start with 6 months and continue discussion at follow-up.  Patient no longer taking aspirin at instruction of emergency med physician. He also ran out of synthroid recently.    Past Medical History:  Diagnosis Date  . ADHD (attention deficit hyperactivity disorder)   . Bipolar 1 disorder (HCC)   . Foreign body of right hand 04/2016   palm  . Hypothyroidism   . Memory impairment   . Tourette syndrome    Past Surgical History:  Procedure Laterality Date  . FOREIGN BODY REMOVAL Right 05/15/2016   Procedure: FOREIGN BODY REMOVAL right palm;  Surgeon: Betha Loa, MD;  Location: [facility];  Service: Orthopedics;  Laterality: Right;  . NO PAST SURGERIES     Social History   Tobacco Use  . Smoking status: Former Smoker    Packs/day: 2.00    Years: 0.30    Pack years: 0.60    Types: Cigarettes    Last attempt to quit: 03/03/2016    Years since quitting: 1.4  . Smokeless tobacco: Never Used  Substance Use Topics  . Alcohol use: No    Alcohol/week: 0.0 oz   family history includes Factor V Leiden deficiency in his  paternal aunt; Hyperlipidemia in his mother; Hypertension in his mother.  ROS as above:  Medications: Current Outpatient Medications  Medication Sig Dispense Refill  . levothyroxine (SYNTHROID, LEVOTHROID) 75 MCG tablet Take 1 tablet (75 mcg total) by mouth daily before breakfast. 90 tablet 1  . aspirin EC 81 MG tablet Take 81 mg by mouth daily as needed.     . rivaroxaban (XARELTO) 20 MG TABS tablet Take 1 tablet (20 mg total) by mouth daily with supper. 90 tablet 1   No current facility-administered medications for this visit.    Allergies  Allergen Reactions  . Risperdal [Risperidone] Other (See Comments)    DYSKINESIA    Health Maintenance Health Maintenance  Topic Date Due  . INFLUENZA VACCINE  04/30/2018 (Originally 02/25/2017)  . TETANUS/TDAP  04/08/2026  . HIV Screening  Completed     Exam:  BP 118/71   Pulse 71   Ht  (1.753 m)   Wt 164 lb (74.4 kg)   BMI 24.22 kg/m  Gen: Well NAD HEENT: EOMI,  MMM Lungs: Normal work of breathing. CTABL Heart: RRR no MRG Abd: NABS, Soft. Nondistended, Nontender Exts: Brisk capillary refill, warm and well perfused.   No results found for this or any previous visit (from the past 72 hour(s)). No results found.  Assessment and Plan: 22 y.o. male here for follow up on PE. Patient has been taking Xarelto for past 22 days. He currently only has 30 day supply.  We will plan for Xarelto for minimum of 6 months. We could consider indefinite therapy given FVL, male sex, and unprovoked PE, but patient resistant to this currently. Will revisit discussion of indefinite treatment at follow up and make individualized decision at that point.  Patient counseled that he should have no day-to-day limitations, including work. He should obviously avoid contact sports and activities that would predispose to head trauma.    No orders of the defined types were placed in this encounter.  Meds ordered this encounter  Medications  .  rivaroxaban (XARELTO) 20 MG TABS tablet    Sig: Take 1 tablet (20 mg total) by mouth daily with supper.    Dispense:  90 tablet    Refill:  1  . levothyroxine (SYNTHROID, LEVOTHROID) 75 MCG tablet    Sig: Take 1 tablet (75 mcg total) by mouth daily before breakfast.    Dispense:  90 tablet    Refill:  1    Disregard prior order     Discussed warning signs or symptoms. Please see discharge instructions. Patient expresses understanding.  I spent 25 minutes with this patient, greater than 50% was face-to-face time counseling regarding treatment plan.

## 2017-09-01 NOTE — Patient Instructions (Signed)
Thank you for coming in today. Continue xeralto 20mg  daily.  Restart Levothyroxine.  Recheck in 1 month.   Off of aspirin while taking Xeralto.   Rivaroxaban oral tablets What is this medicine? RIVAROXABAN (ri va ROX a ban) is an anticoagulant (blood thinner). It is used to treat blood clots in the lungs or in the veins. It is also used after knee or hip surgeries to prevent blood clots. It is also used to lower the chance of stroke in people with a medical condition called atrial fibrillation. This medicine may be used for other purposes; ask your health care provider or pharmacist if you have questions. COMMON BRAND NAME(S): Xarelto, Xarelto Starter Pack What should I tell my health care provider before I take this medicine? They need to know if you have any of these conditions: -bleeding disorders -bleeding in the brain -blood in your stools (black or tarry stools) or if you have blood in your vomit -history of stomach bleeding -kidney disease -liver disease -low blood counts, like low Pavlock cell, platelet, or red cell counts -recent or planned spinal or epidural procedure -take medicines that treat or prevent blood clots -an unusual or allergic reaction to rivaroxaban, other medicines, foods, dyes, or preservatives -pregnant or trying to get pregnant -breast-feeding How should I use this medicine? Take this medicine by mouth with a glass of water. Follow the directions on the prescription label. Take your medicine at regular intervals. Do not take it more often than directed. Do not stop taking except on your doctor's advice. Stopping this medicine may increase your risk of a blood clot. Be sure to refill your prescription before you run out of medicine. If you are taking this medicine after hip or knee replacement surgery, take it with or without food. If you are taking this medicine for atrial fibrillation, take it with your evening meal. If you are taking this medicine to treat  blood clots, take it with food at the same time each day. If you are unable to swallow your tablet, you may crush the tablet and mix it in applesauce. Then, immediately eat the applesauce. You should eat more food right after you eat the applesauce containing the crushed tablet. Talk to your pediatrician regarding the use of this medicine in children. Special care may be needed. Overdosage: If you think you have taken too much of this medicine contact a poison control center or emergency room at once. NOTE: This medicine is only for you. Do not share this medicine with others. What if I miss a dose? If you take your medicine once a day and miss a dose, take the missed dose as soon as you remember. If you take your medicine twice a day and miss a dose, take the missed dose immediately. In this instance, 2 tablets may be taken at the same time. The next day you should take 1 tablet twice a day as directed. What may interact with this medicine? Do not take this medicine with any of the following medications: -defibrotide This medicine may also interact with the following medications: -aspirin and aspirin-like medicines -certain antibiotics like erythromycin, azithromycin, and clarithromycin -certain medicines for fungal infections like ketoconazole and itraconazole -certain medicines for irregular heart beat like amiodarone, quinidine, dronedarone -certain medicines for seizures like carbamazepine, phenytoin -certain medicines that treat or prevent blood clots like warfarin, enoxaparin, and dalteparin -conivaptan -diltiazem -felodipine -indinavir -lopinavir; ritonavir -NSAIDS, medicines for pain and inflammation, like ibuprofen or naproxen -ranolazine -rifampin -ritonavir -SNRIs, medicines  for depression, like desvenlafaxine, duloxetine, levomilnacipran, venlafaxine -SSRIs, medicines for depression, like citalopram, escitalopram, fluoxetine, fluvoxamine, paroxetine, sertraline -St. John's  wort -verapamil This list may not describe all possible interactions. Give your health care provider a list of all the medicines, herbs, non-prescription drugs, or dietary supplements you use. Also tell them if you smoke, drink alcohol, or use illegal drugs. Some items may interact with your medicine. What should I watch for while using this medicine? Visit your doctor or health care professional for regular checks on your progress. Notify your doctor or health care professional and seek emergency treatment if you develop breathing problems; changes in vision; chest pain; severe, sudden headache; pain, swelling, warmth in the leg; trouble speaking; sudden numbness or weakness of the face, arm or leg. These can be signs that your condition has gotten worse. If you are going to have surgery or other procedure, tell your doctor that you are taking this medicine. What side effects may I notice from receiving this medicine? Side effects that you should report to your doctor or health care professional as soon as possible: -allergic reactions like skin rash, itching or hives, swelling of the face, lips, or tongue -back pain -redness, blistering, peeling or loosening of the skin, including inside the mouth -signs and symptoms of bleeding such as bloody or black, tarry stools; red or dark-brown urine; spitting up blood or brown material that looks like coffee grounds; red spots on the skin; unusual bruising or bleeding from the eye, gums, or nose Side effects that usually do not require medical attention (report to your doctor or health care professional if they continue or are bothersome): -dizziness -muscle pain This list may not describe all possible side effects. Call your doctor for medical advice about side effects. You may report side effects to FDA at 1-800-FDA-1088. Where should I keep my medicine? Keep out of the reach of children. Store at room temperature between 15 and 30 degrees C (59 and 86  degrees F). Throw away any unused medicine after the expiration date. NOTE: This sheet is a summary. It may not cover all possible information. If you have questions about this medicine, talk to your doctor, pharmacist, or health care provider.  2018 Elsevier/Gold Standard (2016-04-02 16:29:33)

## 2017-09-29 ENCOUNTER — Ambulatory Visit (INDEPENDENT_AMBULATORY_CARE_PROVIDER_SITE_OTHER): Payer: Medicaid Other | Admitting: Family Medicine

## 2017-09-29 ENCOUNTER — Encounter: Payer: Self-pay | Admitting: Family Medicine

## 2017-09-29 VITALS — BP 126/75 | HR 53 | Ht 69.0 in | Wt 167.0 lb

## 2017-09-29 DIAGNOSIS — D6851 Activated protein C resistance: Secondary | ICD-10-CM

## 2017-09-29 DIAGNOSIS — I2699 Other pulmonary embolism without acute cor pulmonale: Secondary | ICD-10-CM | POA: Diagnosis not present

## 2017-09-29 DIAGNOSIS — F319 Bipolar disorder, unspecified: Secondary | ICD-10-CM | POA: Diagnosis not present

## 2017-09-29 DIAGNOSIS — E079 Disorder of thyroid, unspecified: Secondary | ICD-10-CM

## 2017-09-29 MED ORDER — RIVAROXABAN 20 MG PO TABS: 20.0000 mg | ORAL_TABLET | Freq: Every day | ORAL | 1 refills | Status: AC

## 2017-09-29 NOTE — Patient Instructions (Addendum)
Thank you for coming in today.  Let me know if you worsen.   Get labs now.  Continue to apply for jobs.   Continue medicines.  Let me know if you have problems getting Xeralto.   Recheck with me in 3 months or sooner if needed.

## 2017-09-29 NOTE — Progress Notes (Signed)
Vincent Shaw is a 22 y.o. male who presents to [facility]: Primary Care Sports Medicine today for follow-up pulmonary embolism, hypothyroidism, bipolar disorder.  Pulmonary embolism: Vincent Shaw has a history of homozygous factor V Leiden deficiency with a recent history of his first pulmonary embolism.  He denies any personal history of DVT.  He currently is taking Xarelto daily and tolerating it well.  He denies any bleeding and feels well. He currently has Medicaid but not sure how much longer he will have it.  Hypothyroidism: Vincent Shaw currently takes levothyroxine 75 mcg daily.  He tolerates it well with no significant feeling too cold or too hot skin hair or nail changes.  Bipolar: Vincent Shaw has a history of Bipolar.  He is not currently on any medications and doing well with no depressive or manic episodes or symptoms.  He currently is living with his grandmother.  He does not currently have a job but is trying to apply for some.   Past Medical History:  Diagnosis Date  . ADHD (attention deficit hyperactivity disorder)   . Bipolar 1 disorder (HCC)   . Foreign body of right hand 04/2016   palm  . Hypothyroidism   . Memory impairment   . Tourette syndrome    Past Surgical History:  Procedure Laterality Date  . FOREIGN BODY REMOVAL Right 05/15/2016   Procedure: FOREIGN BODY REMOVAL right palm;  Surgeon: Betha Loa, MD;  Location: [facility];  Service: Orthopedics;  Laterality: Right;  . NO PAST SURGERIES     Social History   Tobacco Use  . Smoking status: Former Smoker    Packs/day: 2.00    Years: 0.30    Pack years: 0.60    Types: Cigarettes    Last attempt to quit: 03/03/2016    Years since quitting: 1.5  . Smokeless tobacco: Never Used  Substance Use Topics  . Alcohol use: No    Alcohol/week: 0.0 oz   family history includes Factor V Leiden deficiency in his paternal  aunt; Hyperlipidemia in his mother; Hypertension in his mother.  ROS as above:  Medications: Current Outpatient Medications  Medication Sig Dispense Refill  . aspirin EC 81 MG tablet Take 81 mg by mouth daily as needed.     Marland Kitchen levothyroxine (SYNTHROID, LEVOTHROID) 75 MCG tablet Take 1 tablet (75 mcg total) by mouth daily before breakfast. 90 tablet 1  . rivaroxaban (XARELTO) 20 MG TABS tablet Take 1 tablet (20 mg total) by mouth daily with supper. 90 tablet 1   No current facility-administered medications for this visit.    Allergies  Allergen Reactions  . Risperdal [Risperidone] Other (See Comments)    DYSKINESIA    Health Maintenance Health Maintenance  Topic Date Due  . INFLUENZA VACCINE  04/30/2018 (Originally 02/25/2017)  . TETANUS/TDAP  04/08/2026  . HIV Screening  Completed     Exam:  BP 126/75   Pulse (!) 53   Ht 5\' 9"  (1.753 m)   Wt 167 lb (75.8 kg)   BMI 24.66 kg/m  Gen: Well NAD HEENT: EOMI,  MMM Lungs: Normal work of breathing. CTABL Heart: RRR no MRG Abd: NABS, Soft. Nondistended, Nontender Exts: Brisk capillary refill, warm and well perfused.  Psych: Alert and oriented normal speech thought process and affect.   No results found for this or any previous visit (from the past 72 hour(s)). No results found.    Assessment and Plan: 22 y.o. male with  Pulmonary embolism:  Doing well on Xarelto.  Continue Xarelto 3 months at least.  Recheck in 3 months or sooner if needed  Hypothyroidism: Recheck TSH and adjust levothyroxine dose.  Patient will need a refill.  Bipolar: Doing well currently.  Discussed the importance of avoiding severe mania or depressive symptoms.  Patient declines medications at this time.  Plan for watchful waiting.   Orders Placed This Encounter  Procedures  . TSH   Meds ordered this encounter  Medications  . rivaroxaban (XARELTO) 20 MG TABS tablet    Sig: Take 1 tablet (20 mg total) by mouth daily with supper.    Dispense:  90  tablet    Refill:  1     Discussed warning signs or symptoms. Please see discharge instructions. Patient expresses understanding.

## 2017-09-30 ENCOUNTER — Other Ambulatory Visit: Payer: Self-pay | Admitting: Family Medicine

## 2017-09-30 LAB — TSH: TSH: 1.39 mIU/L (ref 0.40–4.50)

## 2017-09-30 MED ORDER — LEVOTHYROXINE SODIUM 75 MCG PO TABS: 75.0000 ug | ORAL_TABLET | Freq: Every day | ORAL | 1 refills | Status: AC

## 2017-10-15 IMAGING — DX DG FOREARM 2V*R*
2 series · 2 of 2 positions shown · non-contrast
Comparison: No prior.

CLINICAL DATA: Fall.  Pain.

EXAM:
RIGHT FOREARM - 2 VIEW

[forearm ap]
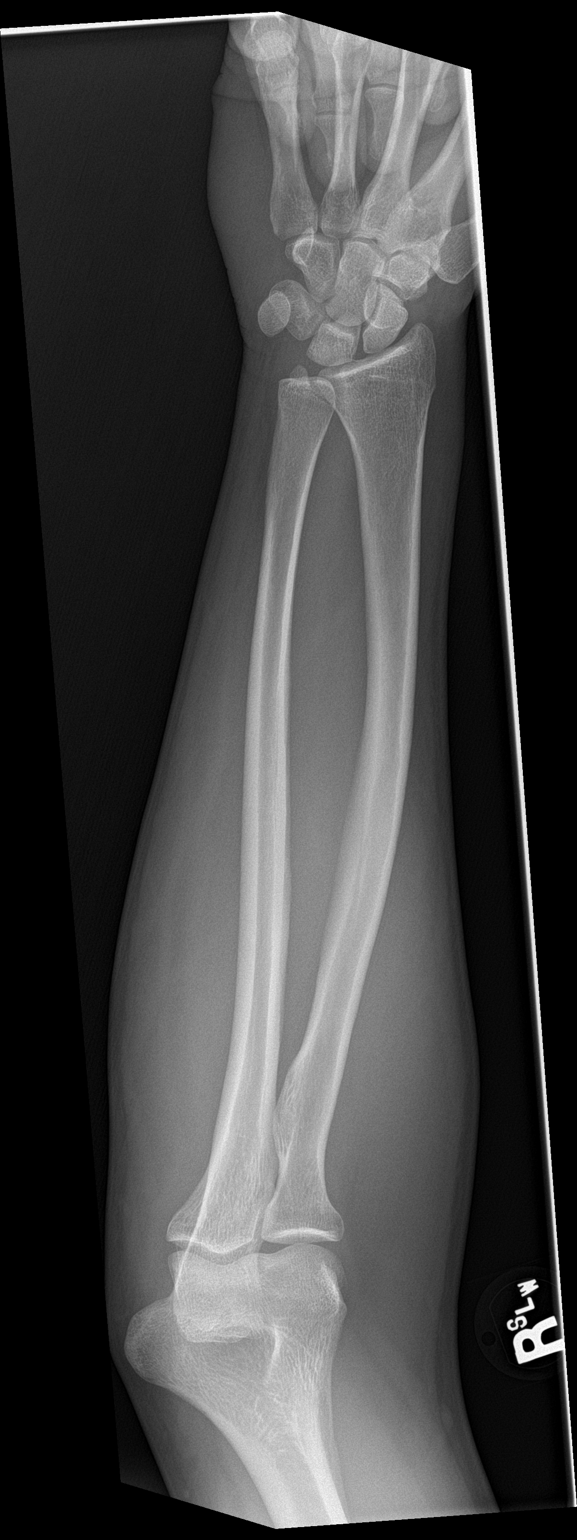

[forearm lat]
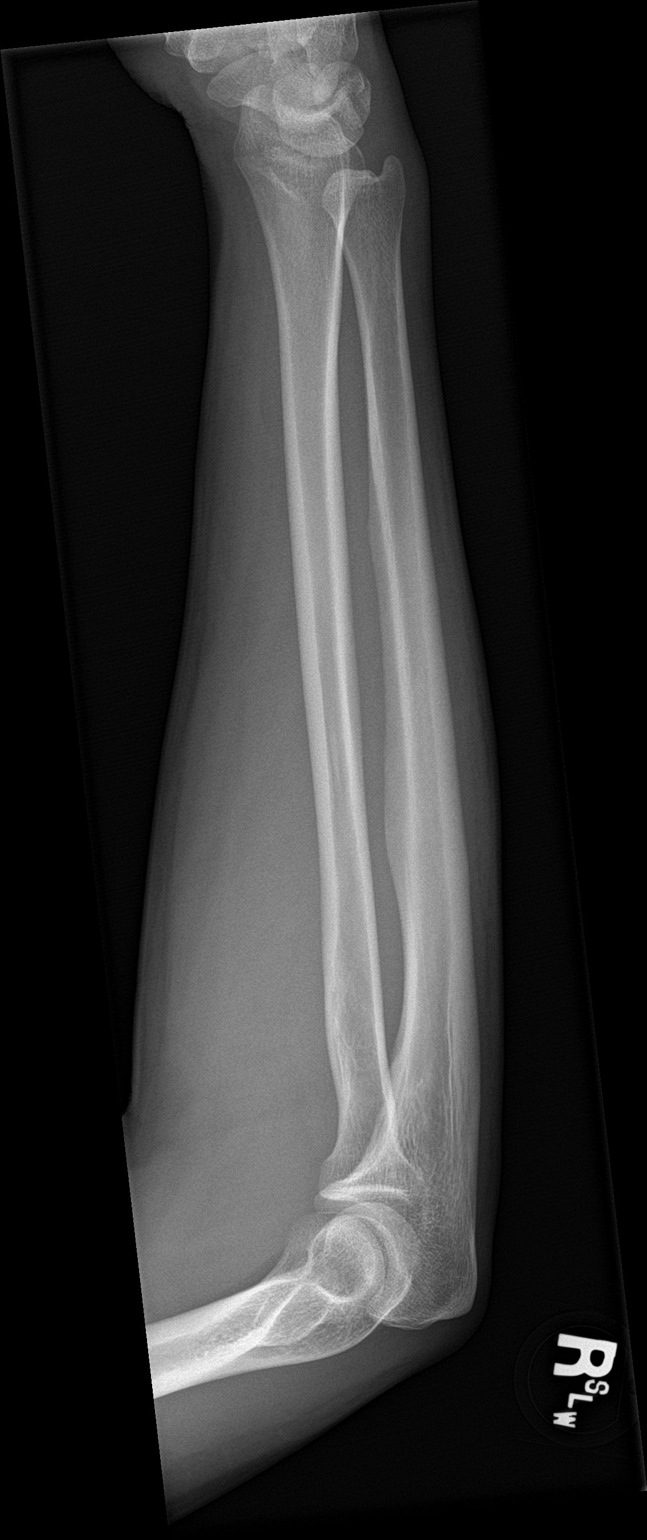

[2 of 2 positions shown; findings below may reference images not displayed]

FINDINGS: No acute bony or joint abnormality. No evidence fracture or
dislocation.
IMPRESSION: No acute abnormality .

## 2017-11-13 ENCOUNTER — Emergency Department (INDEPENDENT_AMBULATORY_CARE_PROVIDER_SITE_OTHER)
Admission: EM | Admit: 2017-11-13 | Discharge: 2017-11-13 | Disposition: A | Discharge status: Home / Self Care | Payer: Medicaid Other | Source: Home / Self Care | Attending: Family Medicine | Admitting: Family Medicine

## 2017-11-13 DIAGNOSIS — J069 Acute upper respiratory infection, unspecified: Secondary | ICD-10-CM | POA: Diagnosis not present

## 2017-11-13 DIAGNOSIS — B9789 Other viral agents as the cause of diseases classified elsewhere: Secondary | ICD-10-CM

## 2017-11-13 NOTE — Discharge Instructions (Addendum)
Take plain guaifenesin (1200mg extended release tabs such as Mucinex) twice daily, with plenty of water, for cough and congestion.  May add Pseudoephedrine (30mg, one or two every 4 to 6 hours) for sinus congestion.  Get adequate rest.   °May use Afrin nasal spray (or generic oxymetazoline) each morning for about 5 days and then discontinue.  Also recommend using saline nasal spray several times daily and saline nasal irrigation (AYR is a common brand).   °Try warm salt water gargles for sore throat.  °Stop all antihistamines for now, and other non-prescription cough/cold preparations. °May take Delsym Cough Suppressant at bedtime for nighttime cough.  °  °
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
## 2017-11-13 NOTE — ED Triage Notes (Signed)
Vincent Shaw complains of runny nose, drainage, sore throat and productive cough for 1 day. He has taken Robitussin, cough drops and Tylenol PM.

## 2017-12-31 ENCOUNTER — Encounter: Payer: Self-pay | Admitting: Family Medicine

## 2017-12-31 ENCOUNTER — Ambulatory Visit (INDEPENDENT_AMBULATORY_CARE_PROVIDER_SITE_OTHER): Payer: Medicaid Other | Admitting: Family Medicine

## 2017-12-31 VITALS — BP 135/60 | HR 55 | Wt 159.0 lb

## 2017-12-31 DIAGNOSIS — F319 Bipolar disorder, unspecified: Secondary | ICD-10-CM

## 2017-12-31 DIAGNOSIS — F952 Tourette's disorder: Secondary | ICD-10-CM

## 2017-12-31 DIAGNOSIS — D6851 Activated protein C resistance: Secondary | ICD-10-CM | POA: Diagnosis not present

## 2017-12-31 DIAGNOSIS — E079 Disorder of thyroid, unspecified: Secondary | ICD-10-CM

## 2017-12-31 MED ORDER — LAMOTRIGINE 100 MG PO TABS: 100.0000 mg | ORAL_TABLET | Freq: Every day | ORAL | 1 refills | Status: DC

## 2017-12-31 MED ORDER — QUETIAPINE FUMARATE 25 MG PO TABS: 25.0000 mg | ORAL_TABLET | Freq: Every day | ORAL | 1 refills | Status: DC

## 2017-12-31 NOTE — Patient Instructions (Addendum)
Thank you for coming in today. Restart Seroquel 1 pill at bedtime.  Restart lamictal 1/2 pill for 1 week then 1 full pill daily.  Restart thyroid medicine.  Continue Xeralto.   Recheck with me in 2-4 weeks.  [facility]

## 2017-12-31 NOTE — Progress Notes (Signed)
Vincent Shaw is a 22 y.o. male who presents to [facility]: Primary Care Sports Medicine today for follow-up mental health/mood, hypothyroidism, insomnia, recurrent pulmonary embolisms/factor V Leiden mutation.  Vincent Shaw has not been doing well recently from a mental health standpoint and he has multiple mental health diagnoses including bipolar disorder as well as attention deficit disorder, disruptive mood dysregulation disorder, conduct disorder, PTSD.  He notes that he has been unable to keep a job and currently living with his grandparents.  Fortunately he has maintained his Medicaid and is able to afford his medications and go to doctor. visits.  He notes that recently he is having fatigue and depressive symptoms.  He denies any SI or HI.  His grandfather notes that he has been muttering to himself under his breath but the patient denies any auditory hallucinations.  In the past he was reasonably well controlled with Lamictal and Seroquel but he discontinued it himself several months ago he has been seen by behavioral health clinic in [place] before but it has been sometime since his last visit. Vincent Shaw said to be a good idea to get back on medications and back to psychiatry.  Hypothyroidism: Vincent Shaw has a history of hypothyroidism.  He previously had dose adjustment and was doing well on 75 mcg of levothyroxine daily.  He has not been taking this medication much and notes increased fatigue.  PE/DVT history with factor V Leiden mutation.  Just this is currently continuing to take his Xarelto and aspirin.  He feels well with no issues.   ROS as above:  Exam:  BP 135/60   Pulse (!) 55   Wt 159 lb (72.1 kg)   BMI 23.48 kg/m  Gen: Well NAD HEENT: EOMI,  MMM Lungs: Normal work of breathing. CTABL Heart: RRR no MRG Abd: NABS, Soft. Nondistended, Nontender Exts: Brisk capillary refill, warm and  well perfused.  Psych: Alert and oriented.  Well-groomed appearing. Normal speech.  Thought process is linear and goal-directed.  Patient has multiple tics.  He does not appear to be responding to hallucinations.  He expresses no delusions.  No SI or HI expressed today.  Depression screen [facility] 2/9 12/31/2017 04/30/2017 04/02/2017 03/03/2017 03/02/2017  Decreased Interest 3 3 3  0 0  Down, Depressed, Hopeless 3 3 3  0 0  PHQ - 2 Score 6 6 6  0 0  Altered sleeping 3 3 2 3 3   Tired, decreased energy 3 3 0 0 0  Change in appetite 3 3 0 0 0  Feeling bad or failure about yourself  3 3 3 1 1   Trouble concentrating 3 0 0 0 0  Moving slowly or fidgety/restless 3 3 0 0 0  Suicidal thoughts 0 0 0 0 0  PHQ-9 Score 24 21 11 4 4   Difficult doing work/chores Very difficult Somewhat difficult - - -  Some encounter information is confidential and restricted. Go to Review Flowsheets activity to see all data.   GAD 7 : Generalized Anxiety Score 12/31/2017 04/30/2017 03/03/2017 03/02/2017  Nervous, Anxious, on Edge 3 3 0 0  Control/stop worrying 0 3 2 2   Worry too much - different things 0 3 3 3   Trouble relaxing 3 3 3 3   Restless 0 1 3 3   Easily annoyed or irritable 3 3 3 3   Afraid - awful might happen 0 3 2 2   Total GAD 7 Score 9 19 16 16   Anxiety Difficulty - Somewhat difficult Somewhat difficult Somewhat  difficult  Some encounter information is confidential and restricted. Go to Review Flowsheets activity to see all data.      Assessment and Plan: 22 y.o. male with mood: Vincent Shaw certainly does have a mood disorder.  I am not entirely sure if it simple bipolar disorder or there is some other coexisting diagnoses including schizoaffective disorder possibly.  I am not sure if his muttering under his breath is truly concerning for hallucinations or is more a behavioral trait.  Regardless Vincent Shaw is not doing well.  He is having significant depressive symptoms.  Plan to restart previously well-tolerated regimen of  Seroquel and Lamictal.  Refer back to behavioral health clinic for psychiatry and counseling.  Recheck with me in 2 weeks.  I discussed safety with the patient and his grandfather.  Hypothyroidism: Recommend restarting levothyroxine as this may help with fatigue.  DVT/PE/factor V Leiden: Continue Xarelto and aspirin.   Orders Placed This Encounter  Procedures  . Ambulatory referral to Behavioral Health    Referral Priority:   Routine    Referral Type:   Psychiatric    Referral Reason:   Specialty Services Required    Requested Specialty:   Behavioral Health    Number of Visits Requested:   1   Meds ordered this encounter  Medications  . QUEtiapine (SEROQUEL) 25 MG tablet    Sig: Take 1 tablet (25 mg total) by mouth at bedtime.    Dispense:  90 tablet    Refill:  1  . lamoTRIgine (LAMICTAL) 100 MG tablet    Sig: Take 1 tablet (100 mg total) by mouth daily.    Dispense:  90 tablet    Refill:  1     Historical information moved to improve visibility of documentation.  Past Medical History:  Diagnosis Date  . ADHD (attention deficit hyperactivity disorder)   . Bipolar 1 disorder (HCC)   . Foreign body of right hand 04/2016   palm  . Hypothyroidism   . Memory impairment   . Tourette syndrome    Past Surgical History:  Procedure Laterality Date  . FOREIGN BODY REMOVAL Right 05/15/2016   Procedure: FOREIGN BODY REMOVAL right palm;  Surgeon: Betha Loa, MD;  Location: [facility];  Service: Orthopedics;  Laterality: Right;  . NO PAST SURGERIES     Social History   Tobacco Use  . Smoking status: Former Smoker    Packs/day: 2.00    Years: 0.30    Pack years: 0.60    Types: Cigarettes    Last attempt to quit: 03/03/2016    Years since quitting: 1.8  . Smokeless tobacco: Never Used  Substance Use Topics  . Alcohol use: No    Alcohol/week: 0.0 oz   family history includes Cancer in his paternal grandmother; Diabetes in his maternal grandmother and paternal  grandmother; Factor V Leiden deficiency in his paternal aunt; Hyperlipidemia in his mother; Hypertension in his mother.  Medications: Current Outpatient Medications  Medication Sig Dispense Refill  . aspirin EC 81 MG tablet Take 81 mg by mouth daily as needed.     . lamoTRIgine (LAMICTAL) 100 MG tablet Take 1 tablet (100 mg total) by mouth daily. 90 tablet 1  . levothyroxine (SYNTHROID, LEVOTHROID) 75 MCG tablet Take 1 tablet (75 mcg total) by mouth daily before breakfast. 90 tablet 1  . QUEtiapine (SEROQUEL) 25 MG tablet Take 1 tablet (25 mg total) by mouth at bedtime. 90 tablet 1  . rivaroxaban (XARELTO) 20 MG TABS tablet  Take 1 tablet (20 mg total) by mouth daily with supper. 90 tablet 1   No current facility-administered medications for this visit.    Allergies  Allergen Reactions  . Risperdal [Risperidone] Other (See Comments)    DYSKINESIA    Health Maintenance Health Maintenance  Topic Date Due  . INFLUENZA VACCINE  04/30/2018 (Originally 02/25/2018)  . TETANUS/TDAP  04/08/2026  . HIV Screening  Completed    Discussed warning signs or symptoms. Please see discharge instructions. Patient expresses understanding.

## 2018-01-14 ENCOUNTER — Ambulatory Visit: Payer: Self-pay | Admitting: Family Medicine

## 2018-01-15 ENCOUNTER — Ambulatory Visit (INDEPENDENT_AMBULATORY_CARE_PROVIDER_SITE_OTHER): Payer: Medicaid Other | Admitting: Family Medicine

## 2018-01-15 ENCOUNTER — Encounter: Payer: Self-pay | Admitting: Family Medicine

## 2018-01-15 VITALS — BP 116/63 | HR 67 | Ht 69.0 in | Wt 162.0 lb

## 2018-01-15 DIAGNOSIS — F319 Bipolar disorder, unspecified: Secondary | ICD-10-CM

## 2018-01-15 DIAGNOSIS — E079 Disorder of thyroid, unspecified: Secondary | ICD-10-CM

## 2018-01-15 DIAGNOSIS — Z86711 Personal history of pulmonary embolism: Secondary | ICD-10-CM | POA: Diagnosis not present

## 2018-01-15 DIAGNOSIS — Z8659 Personal history of other mental and behavioral disorders: Secondary | ICD-10-CM

## 2018-01-15 DIAGNOSIS — F4310 Post-traumatic stress disorder, unspecified: Secondary | ICD-10-CM

## 2018-01-15 DIAGNOSIS — D6851 Activated protein C resistance: Secondary | ICD-10-CM | POA: Diagnosis not present

## 2018-01-15 DIAGNOSIS — F909 Attention-deficit hyperactivity disorder, unspecified type: Secondary | ICD-10-CM

## 2018-01-15 DIAGNOSIS — F3481 Disruptive mood dysregulation disorder: Secondary | ICD-10-CM

## 2018-01-15 NOTE — Patient Instructions (Signed)
Thank you for coming in today. Continue current medicine.  Recheck with me in 6 weeks.  Return sooner if needed.

## 2018-01-15 NOTE — Progress Notes (Signed)
Vincent Shaw is a 22 y.o. male who presents to [facility]: [facility] Medicine today for follow-up mood disorder.  Andrus was seen a few weeks ago for exacerbation of his mood disorder thought to be bipolar disorder.  He in the past was reasonably well treated with Lamictal and Seroquel.  These medications were restarted and both the patient and his grandfather note that he is significantly improved.  He notes that he is calmer now unable to sleep better and having less depressive symptoms.  He notes that he has an upcoming appointment with psychiatry for evaluation for potential disability.  He notes however yesterday he spent some time with friends and did not sleep well last night.  He is very tired this morning and notes that this is a sniffing an outlier from his usual presentation.  He thinks he is doing pretty well and is pretty happy with how things are going.  His grandfather agrees.  Additionally his levothyroxine was restarted for hypothyroidism.  He notes that he is taking this medication and tolerating it well.  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  He continues to take Xarelto.   ROS as above:  Exam:  BP 116/63   Pulse 67   Ht 5\' 9"  (1.753 m)   Wt 162 lb (73.5 kg)   BMI 23.92 kg/m  Gen: Well NAD well-groomed HEENT: EOMI,  MMM Lungs: Normal work of breathing. CTABL Heart: RRR no MRG Abd: NABS, Soft. Nondistended, Nontender Exts: Brisk capillary refill, warm and well perfused.  Psych: Fatigued appearing otherwise affect is normal.  Normal speech thought process.  No SI or HI expressed.  Depression screen [facility] 2/9 01/15/2018 12/31/2017 04/30/2017 04/02/2017 03/03/2017  Decreased Interest 2 3 3 3  0  Down, Depressed, Hopeless 1 3 3 3  0  PHQ - 2 Score 3 6 6 6  0  Altered sleeping 3 3 3 2 3   Tired, decreased energy 3 3 3  0 0  Change in  appetite 1 3 3  0 0  Feeling bad or failure about yourself  1 3 3 3 1   Trouble concentrating 0 3 0 0 0  Moving slowly or fidgety/restless 1 3 3  0 0  Suicidal thoughts 0 0 0 0 0  PHQ-9 Score 12 24 21 11 4   Difficult doing work/chores Somewhat difficult Very difficult Somewhat difficult - -  Some encounter information is confidential and restricted. Go to Review Flowsheets activity to see all data.   GAD 7 : Generalized Anxiety Score 01/15/2018 12/31/2017 04/30/2017 03/03/2017  Nervous, Anxious, on Edge 0 3 3 0  Control/stop worrying 0 0 3 2  Worry too much - different things 1 0 3 3  Trouble relaxing 2 3 3 3   Restless 1 0 1 3  Easily annoyed or irritable 3 3 3 3   Afraid - awful might happen 0 0 3 2  Total GAD 7 Score 7 9 19 16   Anxiety Difficulty Somewhat difficult - Somewhat difficult Somewhat difficult  Some encounter information is confidential and restricted. Go to Review Flowsheets activity to see all data.      Assessment and Plan: 22 y.o. male with  Mood disorder: Doing reasonably well.  Continue current regimen as it seems like he is doing pretty well at the time being.  I am looking forward to his psychiatric evaluation as I think that would be helpful to have a little more  information.  Plan to recheck in 6 weeks.  Return sooner if needed.  Hypothyroidism: Doing well continue levothyroxine.  Factor V Leiden and recurrent VTE.  Continue Xarelto.   Historical information moved to improve visibility of documentation.  Past Medical History:  Diagnosis Date  . ADHD (attention deficit hyperactivity disorder)   . Bipolar 1 disorder (HCC)   . Foreign body of right hand 04/2016   palm  . History of pulmonary embolus (PE) 09/01/2017  . Hypothyroidism   . Memory impairment   . Tourette syndrome    Past Surgical History:  Procedure Laterality Date  . FOREIGN BODY REMOVAL Right 05/15/2016   Procedure: FOREIGN BODY REMOVAL right palm;  Surgeon: Betha LoaKevin Kuzma, MD;  Location: [facility];  Service: Orthopedics;  Laterality: Right;  . NO PAST SURGERIES     Social History   Tobacco Use  . Smoking status: Former Smoker    Packs/day: 2.00    Years: 0.30    Pack years: 0.60    Types: Cigarettes    Last attempt to quit: 03/03/2016    Years since quitting: 1.8  . Smokeless tobacco: Never Used  Substance Use Topics  . Alcohol use: No    Alcohol/week: 0.0 oz   family history includes Cancer in his paternal grandmother; Diabetes in his maternal grandmother and paternal grandmother; Factor V Leiden deficiency in his paternal aunt; Hyperlipidemia in his mother; Hypertension in his mother.  Medications: Current Outpatient Medications  Medication Sig Dispense Refill  . aspirin EC 81 MG tablet Take 81 mg by mouth daily as needed.     . lamoTRIgine (LAMICTAL) 100 MG tablet Take 1 tablet (100 mg total) by mouth daily. 90 tablet 1  . levothyroxine (SYNTHROID, LEVOTHROID) 75 MCG tablet Take 1 tablet (75 mcg total) by mouth daily before breakfast. 90 tablet 1  . QUEtiapine (SEROQUEL) 25 MG tablet Take 1 tablet (25 mg total) by mouth at bedtime. 90 tablet 1  . rivaroxaban (XARELTO) 20 MG TABS tablet Take 1 tablet (20 mg total) by mouth daily with supper. 90 tablet 1   No current facility-administered medications for this visit.    Allergies  Allergen Reactions  . Risperdal [Risperidone] Other (See Comments)    DYSKINESIA    Health Maintenance Health Maintenance  Topic Date Due  . INFLUENZA VACCINE  04/30/2018 (Originally 02/25/2018)  . TETANUS/TDAP  04/08/2026  . HIV Screening  Completed    Discussed warning signs or symptoms. Please see discharge instructions. Patient expresses understanding.

## 2018-01-21 ENCOUNTER — Other Ambulatory Visit: Payer: Self-pay

## 2018-01-21 ENCOUNTER — Ambulatory Visit (INDEPENDENT_AMBULATORY_CARE_PROVIDER_SITE_OTHER): Payer: Medicaid Other | Admitting: Medical

## 2018-01-21 ENCOUNTER — Encounter (HOSPITAL_COMMUNITY): Payer: Self-pay | Admitting: Medical

## 2018-01-21 VITALS — BP 130/80 | HR 43 | Ht 69.0 in | Wt 158.0 lb

## 2018-01-21 DIAGNOSIS — Z8659 Personal history of other mental and behavioral disorders: Secondary | ICD-10-CM

## 2018-01-21 DIAGNOSIS — R001 Bradycardia, unspecified: Secondary | ICD-10-CM

## 2018-01-21 DIAGNOSIS — Z915 Personal history of self-harm: Secondary | ICD-10-CM | POA: Diagnosis not present

## 2018-01-21 DIAGNOSIS — D6851 Activated protein C resistance: Secondary | ICD-10-CM

## 2018-01-21 DIAGNOSIS — F319 Bipolar disorder, unspecified: Secondary | ICD-10-CM

## 2018-01-21 DIAGNOSIS — F958 Other tic disorders: Secondary | ICD-10-CM | POA: Diagnosis not present

## 2018-01-21 DIAGNOSIS — G459 Transient cerebral ischemic attack, unspecified: Secondary | ICD-10-CM | POA: Diagnosis not present

## 2018-01-21 DIAGNOSIS — R739 Hyperglycemia, unspecified: Secondary | ICD-10-CM

## 2018-01-21 DIAGNOSIS — F4310 Post-traumatic stress disorder, unspecified: Secondary | ICD-10-CM

## 2018-01-21 DIAGNOSIS — E032 Hypothyroidism due to medicaments and other exogenous substances: Secondary | ICD-10-CM

## 2018-01-21 DIAGNOSIS — F952 Tourette's disorder: Secondary | ICD-10-CM | POA: Diagnosis not present

## 2018-01-21 DIAGNOSIS — Z9151 Personal history of suicidal behavior: Secondary | ICD-10-CM

## 2018-01-21 MED ORDER — OLANZAPINE 10 MG PO TABS: 10.0000 mg | ORAL_TABLET | Freq: Every day | ORAL | 2 refills | Status: DC

## 2018-01-21 MED ORDER — GUANFACINE HCL 2 MG PO TABS
2.0000 mg | ORAL_TABLET | Freq: Every day | ORAL | 2 refills | Status: DC
Start: 2018-01-21 — End: 2018-03-11

## 2018-01-21 MED ORDER — TETRABENAZINE 12.5 MG PO TABS: 12.5000 mg | ORAL_TABLET | Freq: Every day | ORAL | 2 refills | Status: DC

## 2018-01-21 MED ORDER — LAMOTRIGINE 100 MG PO TABS: 100.0000 mg | ORAL_TABLET | Freq: Two times a day (BID) | ORAL | 2 refills | Status: DC

## 2018-01-21 NOTE — Progress Notes (Signed)
Psychiatric Initial Adult Assessment   Patient Identification: Vincent GerlachJustice Kimbrell MRN:  [id number] Date of Evaluation:  01/24/2018 Referral Source: Laren BoomSean Hommel DO Chief Complaint:   Chief Complaint    Establish Care; Hx of diagnosis of autism; Agitation; ADHD; Stress; Trauma; Anxiety; Depression; Tourette Syndrome; Tics; Marijuana use in remission; Hypothyroidisn; Hyperglycemia     Visit Diagnosis:    ICD-10-CM   1. TIA (transient ischemic attack) G45.9 MR BRAIN W WO CONTRAST  2. Bipolar I disorder (HCC) F31.9   3. PTSD (post-traumatic stress disorder) F43.10   4. History of autism Z86.59   5. History of ADHD Z86.59   6. H/O suicide attempt Z91.5    x 2 Serious overdose 2018 requiring ICU  7. Factor 5 Leiden mutation, heterozygous [facility](HCC) [phone] MR BRAIN W WO CONTRAST  8. Tourette syndrome F95.2   9. Motor tic disorder F95.8   10. H/O conduct disorder Z86.59   11. Hyperglycemia R73.9   12. Bradycardia R00.1   13. 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 He was referred back in August of 2018 after a suicde attempt but never kept that appointment. Instead he returned to his PCP Dr Denyse Amassorey Even who last saw him 12/31/2017.(See Past Psychiatric hx for details) Told Dr Clayburn PertEvan he stopped his medications because he felt good and didnt need them.Today he tells me he felt worse on them ?( I suspect the former rather than the later) Dr Michel HarrowEvan's note on 6/6 reports an improved condition but pt 's presentation today is the complete opposite.Pt says his deterioration began 1 week after June 6 visit stating "I dont know why".  Pt c/o of worsening depression;insomnia .Isolates in basement set up for room as he and his father have had to return to PGGF's home .Anergy-no energy to get up and out,help around the house .Cries himself to sleep. Denies SI/HI. Since last seen he has had a second suicide attempt  (02/2017), lost 4 jobs due to physical and mental health and unable to afford a place to live and eat.His disability claim was denied.He did not appeal.   Associated Signs/Symptoms: Depression Symptoms:  depressed mood, anhedonia, insomnia, psychomotor retardation, fatigue, feelings of worthlessness/guilt, difficulty concentrating, hopelessness, anxiety, loss of energy/fatigue, disturbed sleep, PHQ 9 score 23 Very difficult No thoughts he would be better off dead or hurting himself (Hypo) Manic Symptoms:  Distractibility, Irritable Mood, Labiality of Mood, Anxiety Symptoms:  GAD 7 score 21 Severe anxiety Very difficult  Psychotic Symptoms:  None PTSD Symptoms: Had a traumatic exposure:  Childhood in group homes-verbal;emotional and physical Had a traumatic exposure in the last month:  NO Re-experiencing: Not consciously Hypervigilance:  Yes Hyperarousal:  Difficulty Concentrating-?ADD/ADHD vs depression Increased Startle Response Irritability/Anger Avoidance:  Decreased Interest/Participation   Past Psychiatric History:  Progress Notes by Rodolph Bongorey, Evan S, MD at 01/06/2017 1:40 PM  Davonn Cliffton Shaw is a 22 y.o. male who [facility] today for return to care.  Zayvon has been lost to medical care for the last 6 months. His medical history is significant for bipolar disorder complicated by ADHD Tourette's and hypothyroidism. He was doing pretty well with counseling and medications including Seroquel Lamictal clonidine and tetrabenzine. He stopped taking his medications because he was doing well and felt as though he did not need them anymore. In the interim things have worsened. He  suffered a few weeks ago had episode of left-sided facial droop and left-sided numbness. This lasted several minutes. He attributes this to a TIA. He notes a family history of hypercoagulable status and a personal history of factor V Leiden.  He  notes that he's had several of these episodes attributable to TIA. He's not sure if he's ever had much of a workup for it. Additionally he notes that his mental health is worsening. He has not slept more than an hour a night for the last week and feels a little irritable. He thinks his symptoms are worsening. He denies any SI or HI and does not note any hallucinations currently. His grandfather says that he is not expressing hallucinations or delusions. He has lost his job because he left work abruptly when suffering the symptoms described above is a TIA.  03/09/2017 - 03/10/2017 Hospital Encounter [facility]  [address] [place], [place] [address]  [phone]   CC: Intentional Drug Overdose  History of Present Illness:  Vincent Shaw is a 22 y.o. male with history of Tourette's/ Depression/ Anxiety and previous suicide attempt called 911 today after taking multiple prescribed pills in attempt to kill himself after his girlfriend broke up with him. He stated he took approximately 100 pills crushed and diluted in water and included Seroquel/Lamictal. EMS noted 2 empty bottles on the scene w/ once containing 3 different types of medicines. One of the other medications turned out to be Clonidine. Pt was alert/oriented and ambulatory upon EMS arrival however en route he became responsive only to painful stimuli and had periods of apnea along w/ hypotension/bradycardia.  Upon arrival in ER his BP was soft/ EKG demonstrated bradycardia w/ normal QT interval. Bradycardia persisted til arrival on TICU w/ rates dropping and sustaining in the 36-46 range. He was started on dopamine and presently awake/alert answering questions.   IP CONSULT TO PSYCHIATRY / BH 03/10/2017 Donley Donavon Weigel is a 22 y.o. male with a history of multiple psychiatric disorders as listed above who was interviewed in the TICU with his mother present. He overdosed on full  bottles of Lamictal Seroquel and possibly some clonidine, but he isn't sure about a third medication. He laughs as he describes mixing the pills with water and eating Oreo cookies as he swallowed the slurry of medication then called 911. Psychiatry was asked to see the patient following this intentional overdose. He states he did this because his girlfirend broke up with him. He also had an overdose suicide attempt after another break up with a girlfriend in September 2015, also called 911 after the overdose. Admit to psychiatry, discussed with patient, mother. Will not resume Lamictal or Seroquel, he just overdosed on those and had not been taking them as an outpatient. Family session was conducted on 19th and in attendance were this NP,pt's grandfather and the patient.Pt 's grandfather reported that pt does not take his medications at home,that he is in love with girl who they think is not good for him and he is not listening to any family member's opinion.He reported that pt has a psychiatrist and a therapist but will not keep up with his appointments.Pt states he is a full grown man and that the family should give him some breathing space.He acknowledged that his family is very supportive.Pt's grandfather was told that this is a short term stay,that once stable ,patient are discharged to the next lower level of care which is a follow-up appointment  with his psychiatrist and therapist.That pt is now stable and will be discharged tomorrow with a follow-up appointment.that we implore the family to continue support him and making sure he takes his medicines and attend follow-up appointments.    Discharge Instructions 03/16/2017  Table of Contents for Discharge Instructions  Appointments  Discharge Instr - Other Orders  Appointments Barbie Banner - 03/16/2017 10:39 AM EDT  [facility] Appointment: March 17, 2017 at 4:00pm to receive therapy with Darl Pikes.  [address] [place],  [place] [address] March 19, 2017 at 3:00pm for medication management with Sheppard Plumber, PA Phone: [phone] Fax: [phone] Please attend your discharge appointment with [facility] on March 19, 2017 at 3:00pm for medication management with Sheppard Plumber, PA and March 17, 2017 at 4:00pm to receive therapy with Darl Pikes.   Progress Notes by Rodolph Bong, MD at 04/02/2017 Fady Perrott is a 22 y.o. male who [facility] today for follow-up mood and bipolar disorder. Patient has been managed recently for bipolar and was doing pretty well. However he had a suicidal gesture or attempt in the interval. He broke up with his girlfriend and took a bunch of his medications. Since that he is feeling well and denies any suicidal ideation. He currently takes 25 mg of Seroquel at night and is able to sleep and takes Lamictal 100 mg during the day. He's feeling reasonably well.  However his social situation is worsening. He is about to lose his apartment he'll be homeless in about 3 weeks unless he can find some way to pay for rent.  He has some family support but nowhere to live. Progress Notes by Rodolph Bong, MD at 01/15/2018 Kyon Guo is a 22 y.o. male who presents to [facility] today for follow-up mood disorder.  Kanen was seen a few weeks ago for exacerbation of his mood disorder thought to be bipolar disorder.  He in the past was reasonably well treated with Lamictal and Seroquel.  These medications were restarted and both the patient and his grandfather note that he is significantly improved.  He notes that he is calmer now unable to sleep better and having less depressive symptoms.  He notes that he has an upcoming appointment with psychiatry for evaluation for potential disability.  He notes however yesterday he spent some time with friends and did not sleep well last  night.  He is very tired this morning and notes that this is a sniffing an outlier from his usual presentation.  He thinks he is doing pretty well and is pretty happy with how things are going.  His grandfather agrees. Additionally his levothyroxine was restarted for hypothyroidism.  He notes that he is taking this medication and tolerating it well. 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  He continues to take Xarelto.  Psychiatric history of Tourette's;Bipolar DO ;ADD;ODD;Adjustment DO with disturbance of emotion and conduct who was being folowed by the [facility] til [place] of 2016 when pt became insured by Medicaid and his insurance was not accepted by the Group.He saw Dr Ivan Anchors for physical :  ADMIT DATE: 04/28/2014 DISCHARGE DATE AND TIME: 05/01/2014 Admitting Physician: Emily Filbert,* Discharge Physcian: Mayo Ao, MD ADMISSION DIAGNOSIS: Mental health disorder [F99] Other social stressor [Z65.9] Drug overdose, multiple drugs, intentional self-harm, initial encounter (HCC) [T50.902A] Overdose of antipsychotic, intentional self-harm,  initial encounter [facility]) [T43.502A] Patient's mother was called for collateral information [phone]): She states the patient has a long history of behavioral issues and has been hospitalized multiple times.Mom states that he's been treated for behavior and psychiatric concerns since he was 22 years old  She states he is impulsive and has behavioral issues around her children (42 yr old son, and 73 year old daughter) which is why he is currently staying with his father. Patient's mother states he most recently became suicidal after his girlfriend broke up with him. She says she had warned the girl that she should not date him, because he doesn't understand and it will cause him to overreact if they break up. She also feels he needs long term placement as he already had multiple hospitalizations at this  point without any long term behavioral change.  Previous Psychotropic Medications: Yes See Chart review Medications uncheck to see all  Substance Abuse History in the last 12 months:  No.Was daily Pot user POC 10 panel UDS negative today  Consequences of Substance Abuse: Legal Consequences:  Charged with paraphenalia when seen here initially 2017   Past Medical History: Reviewed Medical History Date Comments  ADHD (attention deficit hyperactivity disorder)    Bipolar 1 disorder (*)    Oppositional defiant behavior    Factor 5 Leiden mutation, heterozygous (*)    OCD (obsessive compulsive disorder) 2003   Intermittent explosive disorder 2005   TBI (traumatic brain injury) (*) 2005   Autism 2005   Tourette syndrome 2003   Clotting disorder (*)  factor 5  Anxiety    Past Surgical History Past Surgical History:  Procedure Laterality Date  . FOREIGN BODY REMOVAL Right 05/15/2016   Procedure: FOREIGN BODY REMOVAL right palm;  Surgeon: Betha Loa, MD;  Location:  SURGERY CENTER;  Service: Orthopedics;  Laterality: Right;  . NO PAST SURGERIES     Family Psychiatric History: + for Alcoholism Bipolar and Schizophrenia on Mother's side  Family History:  Family History  Problem Relation Age of Onset  . Factor V Leiden deficiency Paternal Aunt   . Hypertension Mother   . Hyperlipidemia Mother   . Diabetes Maternal Grandmother   . Diabetes Paternal Grandmother   . Cancer Paternal Grandmother     Social History:   Social History   Socioeconomic History  . Marital status: Single    Spouse name: NA  . Number of children: None  . Years of education: 11 th grade  . Highest education level:   Occupational History  . Unable to keep jobs due to health ( Physical Blood clots/Mental ASD-ADHD Bipolar depressed)  Social Needs  . Financial resource strain: Had to move in with Father into PGGF's home  . Food insecurity:    Worry: No appetite    Inability: No    . Transportation needs:    Medical: Not on file    Non-medical: Not on file  Tobacco Use  . Smoking status: Former Smoker    Packs/day: 2.00    Years: 0.30    Pack years: 0.60    Types: Cigarettes    Last attempt to quit: 03/03/2016    Years since quitting: 1.8  . Smokeless tobacco: Never Used  Substance and Sexual Activity  . Alcohol use: No    Alcohol/week: 0.0 oz  . Drug use: No  . Sexual activity: Yes    Birth control/protection: None  Lifestyle  . Physical activity:None at present due to anergy/anhedonia    Days  per week: Not on file    Minutes per session: Not on file  . Stress: Since age 64 pt has been involved with Psychiatric/Behavioral medicine-Dx of autism appears in documented in 2005 Based on associated diagnoses of ADHD ODD/DMDD , Nocturnal eneuresis;Pot abuse/dependence ,placement in Group homes over much of his childhood, disdain/noncompliance for taking medications it seems that Autism might well be the Primary problem? Currently disabled/unemployed and living in PGGF's basement  No current GF but he has attempted suicide after both relationships ended most recently 02/2017 Records is not clear when he was discovered to have Factor 5 deficiency and how long/when thrombotic events began and what role they play in his Mental Health  Relationships  . Social connections:    Talks on phone: Not on file    Gets together: Not on file    Attends religious service: Not on file    Active member of club or organization: Not on file    Attends meetings of clubs or organizations: Not on file    Relationship status: Not on file  Other Topics Concern  . Not on file  Social History Narrative  . Not on file    Additional Social History:None today  Allergies:   Allergies  Allergen Reactions  . Risperdal [Risperidone] Other (See Comments)    DYSKINESIA    Metabolic Disorder Labs: Glucose, POC 138 (H) 70 - 99 mg/dL [facility]    Lab Results  Component  Value Date   HGBA1C  07/31/2009    5.9 (NOTE) The ADA recommends the following therapeutic goal for glycemic control related to Hgb A1c measurement: Goal of therapy: <6.5 Hgb A1c  Reference: American Diabetes Association: Clinical Practice Recommendations 2010, Diabetes Care, 2010, 33: (Suppl  1).   MPG 123 07/31/2009   Lab Results  Component Value Date   PROLACTIN  07/31/2009    13.9 (NOTE)     Reference Ranges:                 Male:                       2.1 -  17.1 ng/ml                 Male:   Pregnant          9.7 - 208.5 ng/mL                           Non Pregnant      2.8 -  29.2 ng/mL                           Post  Menopausal   1.8 -  20.3 ng/mL                     Lab Results Component Name LIPID PROFILE 03/11/2017   100  80  41  43  16  2  CHOLESTEROL TOTAL  Trig  HDL  LDL  VLDL  CHOL/HDL    Component Value Date   CHOL 123 (L) 06/14/2015   TRIG 39 06/14/2015   HDL 38 (L) 06/14/2015   CHOLHDL 3.2 06/14/2015   VLDL 8 06/14/2015   LDLCALC 77 06/14/2015   LDLCALC  07/31/2009    100        Total Cholesterol/HDL:CHD Risk Coronary Heart Disease Risk Table  Men   Women  1/2 Average Risk   3.4   3.3  Average Risk       5.0   4.4  2 X Average Risk   9.6   7.1  3 X Average Risk  23.4   11.0        Use the calculated Patient Ratio above and the CHD Risk Table to determine the patient's CHD Risk.        ATP III CLASSIFICATION (LDL):  <100     mg/dL   Optimal  324-401  mg/dL   Near or Above                    Optimal  130-159  mg/dL   Borderline  027-253  mg/dL   High  >664     mg/dL   Very High   THYROID Component Name TSH 03/11/2017 03/11/2017   5.41 (H) 3.32      Current Medications: Current Outpatient Medications  Medication Sig Dispense Refill  . lamoTRIgine (LAMICTAL) 100 MG tablet Take 1 tablet (100 mg total) by mouth 2 (two) times daily. 60 tablet 2  . levothyroxine (SYNTHROID, LEVOTHROID) 75 MCG tablet Take 1 tablet (75  mcg total) by mouth daily before breakfast. 90 tablet 1  . rivaroxaban (XARELTO) 20 MG TABS tablet Take 1 tablet (20 mg total) by mouth daily with supper. 90 tablet 1  . amoxicillin-clavulanate (AUGMENTIN) 875-125 MG tablet Take 1 tablet by mouth 2 (two) times daily. One po bid x 7 days 14 tablet 0  . aspirin EC 81 MG tablet Take 81 mg by mouth daily as needed.     Marland Kitchen guanFACINE (TENEX) 2 MG tablet Take 1 tablet (2 mg total) by mouth at bedtime. 30 tablet 2  . OLANZapine (ZYPREXA) 10 MG tablet Take 1 tablet (10 mg total) by mouth at bedtime. 30 tablet 2  . tetrabenazine (XENAZINE) 12.5 MG tablet Take 1 tablet (12.5 mg total) by mouth daily. 30 tablet 2   No current facility-administered medications for this visit.     Neurologic: Headache: Negative Seizure: Negative / TIC disorder Paresthesias:Negative  Musculoskeletal: Strength & Muscle Tone: within normal limits Gait & Station: normal Patient leans: Front  Psychiatric Specialty Exam: Review of Systems  Constitutional: Positive for malaise/fatigue and weight loss. Negative for chills, diaphoresis and fever.  HENT: Negative for congestion, ear discharge, ear pain, hearing loss, nosebleeds, sinus pain, sore throat and tinnitus.   Eyes: Negative for blurred vision, double vision, photophobia, pain, discharge and redness.  Respiratory: Negative for cough, hemoptysis, sputum production, shortness of breath, wheezing and stridor.   Cardiovascular: Negative for chest pain, palpitations, orthopnea, claudication, leg swelling and PND.       Bradycardia  Gastrointestinal: Negative for abdominal pain, blood in stool, constipation, diarrhea, heartburn, melena, nausea and vomiting.  Genitourinary: Negative for dysuria, flank pain, frequency, hematuria and urgency.  Musculoskeletal: Negative for back pain, falls, joint pain, myalgias and neck pain.  Skin: Negative for itching and rash.  Neurological: Positive for tremors (TICS/Tourette's) and  focal weakness (Hx of TIA). Negative for dizziness, tingling (Hx ), sensory change, speech change, seizures, loss of consciousness, weakness and headaches.  Endo/Heme/Allergies: Negative for environmental allergies and polydipsia. Bruises/bleeds easily (Clot disorder Leiden Factor V def).       Hyperglycemia  Psychiatric/Behavioral: Positive for depression and substance abuse (Hx of THC abuse). Negative for hallucinations, memory loss and suicidal ideas. The patient is nervous/anxious and has insomnia.  Blood pressure 130/80, pulse (!) 43, height 5\' 9"  (1.753 m), weight 158 lb (71.7 kg).Body mass index is 23.33 kg/m.  General Appearance: Neat and Well Groomed  Eye Contact:  Poor  Speech:  Slow  Volume:  Decreased  Mood:  Dysphoric  Affect:  Congruent  Thought Process:  Coherent and Descriptions of Associations: Intact  Orientation:  Full (Time, Place, and Person)  Thought Content:  Rumination  Suicidal Thoughts:  No  Homicidal Thoughts:  No  Memory:  Traumatic  Judgement:  Poor  Insight:  Lacking  Psychomotor Activity:  Decreased  Concentration:  Concentration: Fair and Attention Span: Fair  Recall:  Poor  Fund of Knowledge:Fair  Language: Fair  Akathisia:  NA  Handed:  Right  AIMS (if indicated):  Face/Head and neck tics obvious  Assets:  Financial Resources/Insurance Housing Resilience Social Support  ADL's:  Intact  Cognition: Impaired,  Moderate  Sleep:  Poor   Assessment: following issues were addressed:  0 Bipolar I disorder (HCC) Depressed 0 PTSD (post-traumatic stress disorder)  0 History of autism  0 History of ADHD  0 H/O suicide attempt  0 Factor 5 Leiden mutation, heterozygous (HCC)  0 TIA (transient ischemic attack)  0 Tourette syndrome  0 Motor tic disorder  0 H/O conduct disorder  0 Hyperglycemia  0 Bradycardia  0 Hypothyroidism due to medication 0  Treatment Plan Summary:    guanFACINE 2 MG tablet  Commonly known as: TENEX  Bipolar  Depression Insomnia HX Autism ADHD Irritability Take 1 tablet (2 mg total) by mouth at bedtime.   lamoTRIgine 100 MG tablet  Commonly known as: LAMICTAL Bipolar depression  Take 1 tablet (100 mg total) by mouth 2 (two) times daily.      OLANZapine 10 MG tablet  Commonly known as: ZYPREXA  Take 1 tablet (10 mg total) by mouth at bedtime.   Bipolar I   tetrabenazine 12.5 MG tablet  Commonly known Tic      Recommended getting Punching bag Return to Counselor set up FU 2 weeks        Hyperglycemia        Bradycardia - nurse to recheck when he returns for Counseling       Hypothyroidism due to medication        FU with Dr Clayburn Pert -copy of chart sent          Maryjean Morn, PA-C 6/30/20195:19 PM

## 2018-01-22 ENCOUNTER — Ambulatory Visit (INDEPENDENT_AMBULATORY_CARE_PROVIDER_SITE_OTHER): Payer: Medicaid Other | Admitting: Licensed Clinical Social Worker

## 2018-01-22 DIAGNOSIS — Z915 Personal history of self-harm: Secondary | ICD-10-CM

## 2018-01-22 DIAGNOSIS — Z9151 Personal history of suicidal behavior: Secondary | ICD-10-CM

## 2018-01-22 DIAGNOSIS — G479 Sleep disorder, unspecified: Secondary | ICD-10-CM

## 2018-01-22 DIAGNOSIS — F4310 Post-traumatic stress disorder, unspecified: Secondary | ICD-10-CM | POA: Diagnosis not present

## 2018-01-22 DIAGNOSIS — F319 Bipolar disorder, unspecified: Secondary | ICD-10-CM | POA: Diagnosis not present

## 2018-01-22 DIAGNOSIS — F952 Tourette's disorder: Secondary | ICD-10-CM
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
## 2018-01-23 ENCOUNTER — Encounter: Payer: Self-pay | Admitting: Emergency Medicine

## 2018-01-23 ENCOUNTER — Emergency Department (INDEPENDENT_AMBULATORY_CARE_PROVIDER_SITE_OTHER)
Admission: EM | Admit: 2018-01-23 | Discharge: 2018-01-23 | Disposition: A | Discharge status: Home / Self Care | Payer: Medicaid Other | Source: Home / Self Care | Attending: Family Medicine | Admitting: Family Medicine

## 2018-01-23 ENCOUNTER — Emergency Department (INDEPENDENT_AMBULATORY_CARE_PROVIDER_SITE_OTHER): Payer: Medicaid Other

## 2018-01-23 DIAGNOSIS — T07XXXA Unspecified multiple injuries, initial encounter: Secondary | ICD-10-CM | POA: Diagnosis not present

## 2018-01-23 DIAGNOSIS — S41052A Open bite of left shoulder, initial encounter: Secondary | ICD-10-CM

## 2018-01-23 DIAGNOSIS — J3489 Other specified disorders of nose and nasal sinuses: Secondary | ICD-10-CM | POA: Diagnosis not present

## 2018-01-23 DIAGNOSIS — W503XXA Accidental bite by another person, initial encounter: Secondary | ICD-10-CM

## 2018-01-23 MED ORDER — AMOXICILLIN-POT CLAVULANATE 875-125 MG PO TABS: 1.0000 | ORAL_TABLET | Freq: Two times a day (BID) | ORAL | 0 refills | Status: DC

## 2018-01-23 NOTE — Discharge Instructions (Signed)
°  Your x-rays did not show any broken bones in your nose. You may help the pain by applying a cool compress 2-3 times a day.  You may also take acetaminophen (Tylenol) as needed for pain.  Be sure to take the antibiotic to help prevent your open wounds from infection. Please follow up with Dr. Denyse Amassorey later next week for recheck of symptoms if needed.
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
## 2018-01-26 ENCOUNTER — Telehealth (HOSPITAL_COMMUNITY): Payer: Self-pay

## 2018-01-26 NOTE — Telephone Encounter (Signed)
Patient went down to Imaging to get MRI done Friday. Vincent Shaw from Imaging called and said that before MRI can be done a PA has to be submitted through insurance. She also said that they do not provide patients any sedation before MRI is done. If patient needs Xanax or something of that nature then the provider has to provide it. Complete sedation is only done at the main hospital. Vincent JacobsonHelen Shaw that they only do MRI's on Mondays so patient will need to be scheduled and July 8th is already full. She wants to know if Vincent Shaw still wants them schedule the MRI or does he want it done somewhere else that can get the patient in sooner then they can.  She Shaw if Vincent Shaw has any questions he can call her at any time. Please review and advise.

## 2018-01-27 NOTE — Telephone Encounter (Signed)
Spoke with Vincent Shaw at [facility]. She told me to [facility] because no one can change anythiing in the MRI order since it was signed. [facility] let me know that Vincent Shaw needs to put in a new order and put it it for [facility]. She also states for him to specify if he wants oral sedation or IV sedation.

## 2018-01-27 NOTE — Telephone Encounter (Signed)
Lets get it done at Hospital where they can sedate as needed

## 2018-01-29 ENCOUNTER — Other Ambulatory Visit: Payer: Self-pay | Admitting: Medical

## 2018-01-29 DIAGNOSIS — G3281 Cerebellar ataxia in diseases classified elsewhere: Secondary | ICD-10-CM

## 2018-01-29 DIAGNOSIS — R29818 Other symptoms and signs involving the nervous system: Secondary | ICD-10-CM

## 2018-01-29 NOTE — Telephone Encounter (Signed)
MRI ordered for [facility] Please check with Hospital and FU with patient Thanks

## 2018-01-29 NOTE — Progress Notes (Signed)
[facility]

## 2018-02-01 ENCOUNTER — Telehealth (HOSPITAL_COMMUNITY): Payer: Self-pay | Admitting: Licensed Clinical Social Worker

## 2018-02-01 ENCOUNTER — Encounter: Payer: Self-pay | Admitting: Emergency Medicine

## 2018-02-01 ENCOUNTER — Emergency Department
Admission: EM | Admit: 2018-02-01 | Discharge: 2018-02-01 | Disposition: A | Discharge status: Home / Self Care | Payer: Medicaid Other | Source: Home / Self Care | Attending: Family Medicine | Admitting: Family Medicine

## 2018-02-01 DIAGNOSIS — R197 Diarrhea, unspecified: Secondary | ICD-10-CM | POA: Diagnosis not present

## 2018-02-01 DIAGNOSIS — R1084 Generalized abdominal pain: Secondary | ICD-10-CM

## 2018-02-01 DIAGNOSIS — E860 Dehydration: Secondary | ICD-10-CM

## 2018-02-01 DIAGNOSIS — R112 Nausea with vomiting, unspecified: Secondary | ICD-10-CM

## 2018-02-01 LAB — POCT URINALYSIS DIP (MANUAL ENTRY)
Bilirubin, UA: NEGATIVE
Blood, UA: NEGATIVE
Glucose, UA: NEGATIVE mg/dL
Ketones, POC UA: NEGATIVE mg/dL
Leukocytes, UA: NEGATIVE
Nitrite, UA: NEGATIVE
Protein Ur, POC: NEGATIVE mg/dL
Spec Grav, UA: 1.02 (ref 1.010–1.025)
Urobilinogen, UA: 0.2 E.U./dL
pH, UA: 5.5 (ref 5.0–8.0)

## 2018-02-01 MED ORDER — ONDANSETRON HCL 4 MG PO TABS: 4.0000 mg | ORAL_TABLET | Freq: Four times a day (QID) | ORAL | 0 refills | Status: AC

## 2018-02-01 MED ORDER — KETOROLAC TROMETHAMINE 60 MG/2ML IM SOLN: 60.0000 mg | Freq: Once | INTRAMUSCULAR | Status: DC

## 2018-02-01 MED ORDER — IBUPROFEN 600 MG PO TABS
600.0000 mg | ORAL_TABLET | Freq: Once | ORAL | Status: AC
  Administered 2018-02-01: 600 mg via ORAL

## 2018-02-01 MED ORDER — ONDANSETRON 4 MG PO TBDP
4.0000 mg | ORAL_TABLET | Freq: Once | ORAL | Status: AC
  Administered 2018-02-01: 4 mg via ORAL

## 2018-02-01 NOTE — ED Triage Notes (Signed)
Pt c/o N,V,D that started after eating last night. No sxs this am.
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
## 2018-02-01 NOTE — ED Triage Notes (Signed)
Attempted to collect CMP on right AC but pt could not tolerate. No blood drawn.

## 2018-02-01 NOTE — Telephone Encounter (Signed)
Therapist called to provide patient with contact information for potential therapists who specialize in trauma and accept Medicaid insurance.  His grandfather wrote down the information provided.  1.  [facility] [place]  [phone]  2.  Vincent Shaw  [address] [place], [place]  [phone]  3.  Vincent HowellsWanda Shaw [address] [place], [place] [phone]  4.  [facility] [address] [place], [place]  [phone]

## 2018-02-01 NOTE — Discharge Instructions (Signed)
°  You may take 500mg  acetaminophen every 4-6 hours or in combination with ibuprofen 400-600mg  every 6-8 hours as needed for pain, inflammation, and fever.  Be sure to drink at least eight 8oz glasses of water to stay well hydrated and get at least 8 hours of sleep at night, preferably more while sick.   Please follow up with family medicine in 2-3 days if not improving, sooner if worsening.  Please call 911 or go to the hospital if symptoms worsening- worsening pain, unable to keep down fluids, dizziness, passing out, or other new concerning symptoms develop.

## 2018-02-01 NOTE — ED Notes (Signed)
IV attempted on left arm, AC, not tolerated well.  Blood return, pt moaned, groaned and growled loudly.  Removed.

## 2018-02-04 ENCOUNTER — Ambulatory Visit (INDEPENDENT_AMBULATORY_CARE_PROVIDER_SITE_OTHER): Payer: Medicaid Other | Admitting: Medical

## 2018-02-04 ENCOUNTER — Encounter (HOSPITAL_COMMUNITY): Payer: Self-pay | Admitting: Medical

## 2018-02-04 VITALS — BP 138/86 | HR 49 | Ht 69.0 in | Wt 168.0 lb

## 2018-02-04 DIAGNOSIS — F319 Bipolar disorder, unspecified: Secondary | ICD-10-CM

## 2018-02-04 DIAGNOSIS — E032 Hypothyroidism due to medicaments and other exogenous substances: Secondary | ICD-10-CM

## 2018-02-04 DIAGNOSIS — Z8659 Personal history of other mental and behavioral disorders: Secondary | ICD-10-CM | POA: Diagnosis not present

## 2018-02-04 DIAGNOSIS — F4310 Post-traumatic stress disorder, unspecified: Secondary | ICD-10-CM | POA: Diagnosis not present

## 2018-02-04 DIAGNOSIS — F1211 Cannabis abuse, in remission: Secondary | ICD-10-CM

## 2018-02-04 DIAGNOSIS — Z9151 Personal history of suicidal behavior: Secondary | ICD-10-CM

## 2018-02-04 DIAGNOSIS — G459 Transient cerebral ischemic attack, unspecified: Secondary | ICD-10-CM | POA: Diagnosis not present

## 2018-02-04 DIAGNOSIS — F3481 Disruptive mood dysregulation disorder: Secondary | ICD-10-CM

## 2018-02-04 DIAGNOSIS — F958 Other tic disorders: Secondary | ICD-10-CM | POA: Diagnosis not present

## 2018-02-04 DIAGNOSIS — Z915 Personal history of self-harm: Secondary | ICD-10-CM | POA: Diagnosis not present

## 2018-02-04 DIAGNOSIS — F952 Tourette's disorder: Secondary | ICD-10-CM

## 2018-02-04 DIAGNOSIS — R739 Hyperglycemia, unspecified: Secondary | ICD-10-CM

## 2018-02-04 DIAGNOSIS — R001 Bradycardia, unspecified: Secondary | ICD-10-CM

## 2018-02-04 DIAGNOSIS — G479 Sleep disorder, unspecified: Secondary | ICD-10-CM

## 2018-02-04 DIAGNOSIS — D6851 Activated protein C resistance: Secondary | ICD-10-CM | POA: Diagnosis not present
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
## 2018-02-26 ENCOUNTER — Ambulatory Visit: Payer: Self-pay | Admitting: Family Medicine

## 2018-03-04 ENCOUNTER — Ambulatory Visit (HOSPITAL_COMMUNITY): Payer: Medicaid Other | Admitting: Medical

## 2018-03-11 ENCOUNTER — Other Ambulatory Visit: Payer: Self-pay

## 2018-03-11 ENCOUNTER — Encounter (HOSPITAL_COMMUNITY): Payer: Self-pay | Admitting: Medical

## 2018-03-11 ENCOUNTER — Ambulatory Visit (INDEPENDENT_AMBULATORY_CARE_PROVIDER_SITE_OTHER): Payer: Medicaid Other | Admitting: Medical

## 2018-03-11 VITALS — BP 100/68 | HR 64 | Ht 69.0 in | Wt 164.0 lb

## 2018-03-11 DIAGNOSIS — F39 Unspecified mood [affective] disorder: Secondary | ICD-10-CM

## 2018-03-11 DIAGNOSIS — G459 Transient cerebral ischemic attack, unspecified: Secondary | ICD-10-CM

## 2018-03-11 DIAGNOSIS — E032 Hypothyroidism due to medicaments and other exogenous substances: Secondary | ICD-10-CM

## 2018-03-11 DIAGNOSIS — F958 Other tic disorders: Secondary | ICD-10-CM | POA: Diagnosis not present

## 2018-03-11 DIAGNOSIS — Z915 Personal history of self-harm: Secondary | ICD-10-CM

## 2018-03-11 DIAGNOSIS — Z8659 Personal history of other mental and behavioral disorders: Secondary | ICD-10-CM

## 2018-03-11 DIAGNOSIS — F319 Bipolar disorder, unspecified: Secondary | ICD-10-CM | POA: Diagnosis not present

## 2018-03-11 DIAGNOSIS — F952 Tourette's disorder: Secondary | ICD-10-CM | POA: Diagnosis not present

## 2018-03-11 DIAGNOSIS — Z9151 Personal history of suicidal behavior: Secondary | ICD-10-CM

## 2018-03-11 MED ORDER — OLANZAPINE 10 MG PO TABS: 10.0000 mg | ORAL_TABLET | Freq: Every day | ORAL | 2 refills | Status: DC

## 2018-03-11 MED ORDER — LAMOTRIGINE 100 MG PO TABS: 100.0000 mg | ORAL_TABLET | Freq: Two times a day (BID) | ORAL | 2 refills | Status: DC

## 2018-03-11 MED ORDER — GUANFACINE HCL 2 MG PO TABS: 2.0000 mg | ORAL_TABLET | Freq: Every day | ORAL | 2 refills | Status: DC

## 2018-03-11 MED ORDER — TETRABENAZINE 12.5 MG PO TABS: 12.5000 mg | ORAL_TABLET | Freq: Every day | ORAL | 2 refills | Status: DC
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
## 2018-04-08 ENCOUNTER — Ambulatory Visit (INDEPENDENT_AMBULATORY_CARE_PROVIDER_SITE_OTHER): Payer: Medicaid Other | Admitting: Medical

## 2018-04-08 VITALS — BP 132/74 | HR 64 | Ht 69.0 in | Wt 155.2 lb

## 2018-04-08 DIAGNOSIS — F319 Bipolar disorder, unspecified: Secondary | ICD-10-CM | POA: Diagnosis not present

## 2018-04-08 DIAGNOSIS — E032 Hypothyroidism due to medicaments and other exogenous substances: Secondary | ICD-10-CM

## 2018-04-08 DIAGNOSIS — F958 Other tic disorders: Secondary | ICD-10-CM

## 2018-04-08 DIAGNOSIS — Z8659 Personal history of other mental and behavioral disorders: Secondary | ICD-10-CM

## 2018-04-08 DIAGNOSIS — Z915 Personal history of self-harm: Secondary | ICD-10-CM | POA: Diagnosis not present

## 2018-04-08 DIAGNOSIS — D6851 Activated protein C resistance: Secondary | ICD-10-CM

## 2018-04-08 DIAGNOSIS — Z9151 Personal history of suicidal behavior: Secondary | ICD-10-CM

## 2018-04-08 DIAGNOSIS — F4310 Post-traumatic stress disorder, unspecified: Secondary | ICD-10-CM

## 2018-04-08 DIAGNOSIS — G479 Sleep disorder, unspecified: Secondary | ICD-10-CM

## 2018-04-08 DIAGNOSIS — G459 Transient cerebral ischemic attack, unspecified: Secondary | ICD-10-CM

## 2018-04-08 MED ORDER — GUANFACINE HCL 2 MG PO TABS: 2.0000 mg | ORAL_TABLET | Freq: Every day | ORAL | 2 refills | Status: AC

## 2018-04-08 MED ORDER — LAMOTRIGINE 100 MG PO TABS: 100.0000 mg | ORAL_TABLET | Freq: Two times a day (BID) | ORAL | 2 refills | Status: AC

## 2018-04-08 MED ORDER — TETRABENAZINE 12.5 MG PO TABS: 12.5000 mg | ORAL_TABLET | Freq: Every day | ORAL | 2 refills | Status: AC

## 2018-04-08 MED ORDER — OLANZAPINE 10 MG PO TABS
10.0000 mg | ORAL_TABLET | Freq: Every day | ORAL | 2 refills | Status: AC
Start: 2018-04-08 — End: 2019-04-08
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
## 2018-04-09 ENCOUNTER — Encounter (HOSPITAL_COMMUNITY): Payer: Self-pay | Admitting: Medical

## 2018-04-14 ENCOUNTER — Other Ambulatory Visit: Payer: Self-pay

## 2018-04-14 ENCOUNTER — Encounter: Payer: Self-pay | Admitting: Emergency Medicine

## 2018-04-14 ENCOUNTER — Emergency Department
Admission: EM | Admit: 2018-04-14 | Discharge: 2018-04-14 | Disposition: A | Discharge status: Home / Self Care | Payer: Medicaid Other | Source: Home / Self Care | Attending: Family Medicine | Admitting: Family Medicine

## 2018-04-14 DIAGNOSIS — J40 Bronchitis, not specified as acute or chronic: Secondary | ICD-10-CM | POA: Diagnosis not present

## 2018-04-14 MED ORDER — DOXYCYCLINE HYCLATE 100 MG PO CAPS: 100.0000 mg | ORAL_CAPSULE | Freq: Two times a day (BID) | ORAL | 0 refills | Status: AC

## 2018-04-14 MED ORDER — BENZONATATE 200 MG PO CAPS: ORAL_CAPSULE | ORAL | 0 refills | Status: AC

## 2018-04-14 MED ORDER — PREDNISONE 50 MG PO TABS: ORAL_TABLET | ORAL | 0 refills | Status: AC

## 2018-04-14 NOTE — Discharge Instructions (Addendum)
Take plain guaifenesin (1200mg extended release tabs such as Mucinex) twice daily, with plenty of water, for cough and congestion.  May add Pseudoephedrine (30mg, one or two every 4 to 6 hours) for sinus congestion.  Get adequate rest.   °May take Delsym Cough Suppressant with Tessalon at bedtime for nighttime cough.  °Stop all antihistamines for now, and other non-prescription cough/cold preparations. °  °  °

## 2018-04-14 NOTE — ED Triage Notes (Signed)
Productive cough x 2.5 weeks, green mucus
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
## 2018-07-02 ENCOUNTER — Other Ambulatory Visit: Payer: Self-pay

## 2018-07-02 ENCOUNTER — Encounter: Payer: Self-pay | Admitting: Emergency Medicine

## 2018-07-02 ENCOUNTER — Emergency Department (INDEPENDENT_AMBULATORY_CARE_PROVIDER_SITE_OTHER): Payer: Self-pay

## 2018-07-02 ENCOUNTER — Emergency Department (INDEPENDENT_AMBULATORY_CARE_PROVIDER_SITE_OTHER)
Admission: EM | Admit: 2018-07-02 | Discharge: 2018-07-02 | Disposition: A | Discharge status: Home / Self Care | Payer: Self-pay | Source: Home / Self Care | Attending: Family Medicine | Admitting: Family Medicine

## 2018-07-02 DIAGNOSIS — S76012A Strain of muscle, fascia and tendon of left hip, initial encounter: Secondary | ICD-10-CM

## 2018-07-02 DIAGNOSIS — M25552 Pain in left hip: Secondary | ICD-10-CM

## 2018-07-02 NOTE — Discharge Instructions (Addendum)
Apply ice pack for 20 to 30 minutes, 3 to 4 times daily  Continue until pain and swelling decrease.  May take Ibuprofen 200mg, 4 tabs every 8 hours with food.  Begin range of motion and stretching exercises as tolerated. °
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
## 2018-07-08 ENCOUNTER — Ambulatory Visit (HOSPITAL_COMMUNITY): Payer: Self-pay | Admitting: Medical
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
